# Patient Record
Sex: Female | Born: 1965
Health system: Southern US, Community
[De-identification: ages and names within clinical notes are randomized; demographics above are authoritative.]

## PROBLEM LIST (undated history)

## (undated) DIAGNOSIS — IMO0001 Reserved for inherently not codable concepts without codable children: Secondary | ICD-10-CM

## (undated) DIAGNOSIS — Z923 Personal history of irradiation: Secondary | ICD-10-CM

## (undated) DIAGNOSIS — IMO0002 Reserved for concepts with insufficient information to code with codable children: Secondary | ICD-10-CM

## (undated) DIAGNOSIS — C50919 Malignant neoplasm of unspecified site of unspecified female breast: Secondary | ICD-10-CM

## (undated) DIAGNOSIS — Z8669 Personal history of other diseases of the nervous system and sense organs: Secondary | ICD-10-CM

## (undated) DIAGNOSIS — C50412 Malignant neoplasm of upper-outer quadrant of left female breast: Principal | ICD-10-CM

## (undated) DIAGNOSIS — F419 Anxiety disorder, unspecified: Secondary | ICD-10-CM

## (undated) HISTORY — PX: WISDOM TOOTH EXTRACTION: SHX21

## (undated) HISTORY — PX: COLONOSCOPY: SHX174

## (undated) HISTORY — DX: Reserved for concepts with insufficient information to code with codable children: IMO0002

## (undated) HISTORY — DX: Malignant neoplasm of upper-outer quadrant of left female breast: C50.412

## (undated) HISTORY — DX: Personal history of other diseases of the nervous system and sense organs: Z86.69

## (undated) HISTORY — DX: Malignant neoplasm of unspecified site of unspecified female breast: C50.919

## (undated) HISTORY — DX: Reserved for inherently not codable concepts without codable children: IMO0001

---

## 1994-02-26 HISTORY — PX: COLONOSCOPY: SHX174

## 2001-02-26 DIAGNOSIS — I639 Cerebral infarction, unspecified: Secondary | ICD-10-CM

## 2001-02-26 HISTORY — DX: Cerebral infarction, unspecified: I63.9

## 2002-08-21 ENCOUNTER — Other Ambulatory Visit: Admission: RE | Admit: 2002-08-21 | Discharge: 2002-08-21 | Payer: Self-pay | Admitting: *Deleted

## 2006-06-06 ENCOUNTER — Other Ambulatory Visit: Admission: RE | Admit: 2006-06-06 | Discharge: 2006-06-06 | Payer: Self-pay | Admitting: Gynecology

## 2007-01-07 ENCOUNTER — Other Ambulatory Visit: Admission: RE | Admit: 2007-01-07 | Discharge: 2007-01-07 | Payer: Self-pay | Admitting: Gynecology

## 2009-05-24 ENCOUNTER — Encounter: Admission: RE | Admit: 2009-05-24 | Discharge: 2009-05-24 | Payer: Self-pay | Admitting: Gynecology

## 2010-10-09 ENCOUNTER — Other Ambulatory Visit: Payer: Self-pay | Admitting: Gynecology

## 2010-10-09 DIAGNOSIS — Z1231 Encounter for screening mammogram for malignant neoplasm of breast: Secondary | ICD-10-CM

## 2010-10-16 ENCOUNTER — Ambulatory Visit
Admission: RE | Admit: 2010-10-16 | Discharge: 2010-10-16 | Disposition: A | Discharge status: Home / Self Care | Payer: BC Managed Care – PPO | Source: Ambulatory Visit | Attending: Gynecology | Admitting: Gynecology

## 2010-10-16 DIAGNOSIS — Z1231 Encounter for screening mammogram for malignant neoplasm of breast: Secondary | ICD-10-CM

## 2011-11-05 ENCOUNTER — Other Ambulatory Visit: Payer: Self-pay | Admitting: Gynecology

## 2011-11-05 DIAGNOSIS — Z1231 Encounter for screening mammogram for malignant neoplasm of breast: Secondary | ICD-10-CM

## 2011-11-12 ENCOUNTER — Ambulatory Visit
Admission: RE | Admit: 2011-11-12 | Discharge: 2011-11-12 | Disposition: A | Discharge status: Home / Self Care | Payer: BC Managed Care – PPO | Source: Ambulatory Visit | Attending: Gynecology | Admitting: Gynecology

## 2011-11-12 DIAGNOSIS — Z1231 Encounter for screening mammogram for malignant neoplasm of breast: Secondary | ICD-10-CM

## 2013-01-14 ENCOUNTER — Other Ambulatory Visit: Payer: Self-pay

## 2013-01-14 DIAGNOSIS — Z1231 Encounter for screening mammogram for malignant neoplasm of breast: Secondary | ICD-10-CM

## 2013-02-10 ENCOUNTER — Ambulatory Visit
Admission: RE | Admit: 2013-02-10 | Discharge: 2013-02-10 | Disposition: A | Discharge status: Home / Self Care | Payer: BC Managed Care – PPO | Source: Ambulatory Visit

## 2013-02-10 DIAGNOSIS — Z1231 Encounter for screening mammogram for malignant neoplasm of breast: Secondary | ICD-10-CM

## 2015-02-22 ENCOUNTER — Other Ambulatory Visit: Payer: Self-pay | Admitting: Gynecology

## 2015-02-22 DIAGNOSIS — R928 Other abnormal and inconclusive findings on diagnostic imaging of breast: Secondary | ICD-10-CM

## 2015-02-27 HISTORY — PX: BREAST LUMPECTOMY: SHX2

## 2015-03-03 ENCOUNTER — Other Ambulatory Visit: Payer: Self-pay | Admitting: Gynecology

## 2015-03-03 ENCOUNTER — Ambulatory Visit
Admission: RE | Admit: 2015-03-03 | Discharge: 2015-03-03 | Disposition: A | Discharge status: Home / Self Care | Payer: BLUE CROSS/BLUE SHIELD | Source: Ambulatory Visit | Attending: Gynecology | Admitting: Gynecology

## 2015-03-03 DIAGNOSIS — R928 Other abnormal and inconclusive findings on diagnostic imaging of breast: Secondary | ICD-10-CM

## 2015-03-03 DIAGNOSIS — N6320 Unspecified lump in the left breast, unspecified quadrant: Secondary | ICD-10-CM

## 2015-03-07 ENCOUNTER — Other Ambulatory Visit: Payer: Self-pay | Admitting: Gynecology

## 2015-03-07 DIAGNOSIS — N6320 Unspecified lump in the left breast, unspecified quadrant: Secondary | ICD-10-CM

## 2015-03-08 ENCOUNTER — Ambulatory Visit
Admission: RE | Admit: 2015-03-08 | Discharge: 2015-03-08 | Disposition: A | Discharge status: Home / Self Care | Payer: BLUE CROSS/BLUE SHIELD | Source: Ambulatory Visit | Attending: Gynecology | Admitting: Gynecology

## 2015-03-08 DIAGNOSIS — N6320 Unspecified lump in the left breast, unspecified quadrant: Secondary | ICD-10-CM

## 2015-03-10 ENCOUNTER — Telehealth: Payer: Self-pay | Admitting: *Deleted

## 2015-03-10 ENCOUNTER — Encounter: Payer: Self-pay | Admitting: *Deleted

## 2015-03-10 DIAGNOSIS — C50412 Malignant neoplasm of upper-outer quadrant of left female breast: Secondary | ICD-10-CM

## 2015-03-10 HISTORY — DX: Malignant neoplasm of upper-outer quadrant of left female breast: C50.412

## 2015-03-10 NOTE — Telephone Encounter (Signed)
Confirmed [facility] for 03/16/15 at 1230 .  Instructions and contact information given. 

## 2015-03-11 ENCOUNTER — Telehealth: Payer: Self-pay | Admitting: *Deleted

## 2015-03-11 NOTE — Telephone Encounter (Signed)
Mailed clinic packet to pt.  

## 2015-03-16 ENCOUNTER — Encounter: Payer: Self-pay | Admitting: Nurse Practitioner

## 2015-03-16 ENCOUNTER — Encounter: Payer: Self-pay | Admitting: Hematology and Oncology

## 2015-03-16 ENCOUNTER — Other Ambulatory Visit (HOSPITAL_BASED_OUTPATIENT_CLINIC_OR_DEPARTMENT_OTHER): Payer: BLUE CROSS/BLUE SHIELD

## 2015-03-16 ENCOUNTER — Ambulatory Visit (HOSPITAL_BASED_OUTPATIENT_CLINIC_OR_DEPARTMENT_OTHER): Payer: BLUE CROSS/BLUE SHIELD | Admitting: Hematology and Oncology

## 2015-03-16 ENCOUNTER — Encounter: Payer: Self-pay | Admitting: Physical Therapy

## 2015-03-16 ENCOUNTER — Other Ambulatory Visit: Payer: Self-pay | Admitting: *Deleted

## 2015-03-16 ENCOUNTER — Other Ambulatory Visit: Payer: Self-pay | Admitting: General Surgery

## 2015-03-16 ENCOUNTER — Ambulatory Visit
Admission: RE | Admit: 2015-03-16 | Discharge: 2015-03-16 | Disposition: A | Discharge status: Home / Self Care | Payer: BLUE CROSS/BLUE SHIELD | Source: Ambulatory Visit | Attending: Radiation Oncology | Admitting: Radiation Oncology

## 2015-03-16 ENCOUNTER — Ambulatory Visit: Payer: BLUE CROSS/BLUE SHIELD | Attending: General Surgery | Admitting: Physical Therapy

## 2015-03-16 VITALS — BP 138/73 | HR 83 | Temp 98.2°F | Resp 18 | Ht 64.0 in | Wt 147.2 lb

## 2015-03-16 DIAGNOSIS — C50412 Malignant neoplasm of upper-outer quadrant of left female breast: Secondary | ICD-10-CM

## 2015-03-16 DIAGNOSIS — R293 Abnormal posture: Secondary | ICD-10-CM | POA: Diagnosis present

## 2015-03-16 DIAGNOSIS — C50912 Malignant neoplasm of unspecified site of left female breast: Secondary | ICD-10-CM

## 2015-03-16 DIAGNOSIS — Z170 Estrogen receptor positive status [ER+]: Secondary | ICD-10-CM

## 2015-03-16 LAB — CBC WITH DIFFERENTIAL/PLATELET
BASO%: 0.5 % (ref 0.0–2.0)
Basophils Absolute: 0.1 10*3/uL (ref 0.0–0.1)
EOS%: 2.4 % (ref 0.0–7.0)
Eosinophils Absolute: 0.3 10*3/uL (ref 0.0–0.5)
HEMATOCRIT: 42.7 % (ref 34.8–46.6)
HGB: 14.4 g/dL (ref 11.6–15.9)
LYMPH#: 2.9 10*3/uL (ref 0.9–3.3)
LYMPH%: 27.2 % (ref 14.0–49.7)
MCH: 33.5 pg (ref 25.1–34.0)
MCHC: 33.7 g/dL (ref 31.5–36.0)
MCV: 99.4 fL (ref 79.5–101.0)
MONO#: 0.5 10*3/uL (ref 0.1–0.9)
MONO%: 4.9 % (ref 0.0–14.0)
NEUT#: 7 10*3/uL — ABNORMAL HIGH (ref 1.5–6.5)
NEUT%: 65 % (ref 38.4–76.8)
Platelets: 263 10*3/uL (ref 145–400)
RBC: 4.29 10*6/uL (ref 3.70–5.45)
RDW: 12.7 % (ref 11.2–14.5)
WBC: 10.7 10*3/uL — ABNORMAL HIGH (ref 3.9–10.3)

## 2015-03-16 LAB — COMPREHENSIVE METABOLIC PANEL
ALT: 13 U/L (ref 0–55)
AST: 16 U/L (ref 5–34)
Albumin: 4.1 g/dL (ref 3.5–5.0)
Alkaline Phosphatase: 59 U/L (ref 40–150)
Anion Gap: 8 mEq/L (ref 3–11)
BUN: 9.3 mg/dL (ref 7.0–26.0)
CHLORIDE: 107 meq/L (ref 98–109)
CO2: 27 meq/L (ref 22–29)
CREATININE: 0.8 mg/dL (ref 0.6–1.1)
Calcium: 9.3 mg/dL (ref 8.4–10.4)
EGFR: 83 mL/min/{1.73_m2} — ABNORMAL LOW (ref >90)
GLUCOSE: 95 mg/dL (ref 70–140)
Potassium: 3.9 mEq/L (ref 3.5–5.1)
SODIUM: 142 meq/L (ref 136–145)
Total Bilirubin: 1.22 mg/dL — ABNORMAL HIGH (ref 0.20–1.20)
Total Protein: 6.9 g/dL (ref 6.4–8.3)

## 2015-03-16 MED ORDER — ZOLPIDEM TARTRATE 5 MG PO TABS: 5.0000 mg | ORAL_TABLET | Freq: Every evening | ORAL | Status: DC | PRN

## 2015-03-16 NOTE — Therapy (Signed)
[facility] [place], [place], [address] Phone: [phone]   Fax:  [phone]  Physical Therapy Evaluation  Patient Details  Name: Bethany Valdez MRN: [id number] Date of Birth: 07/29/1965 Referring Provider: Dr. Rolm Bookbinder  Encounter Date: 03/16/2015      PT End of Session - 03/16/15 1724    Visit Number 1   Number of Visits 1   PT Start Time 1346   PT Stop Time 6754  Also saw pt from 1520-1534 for a total of 23 minutes   PT Time Calculation (min) 9 min   Activity Tolerance Patient tolerated treatment well   Behavior During Therapy [facility] for tasks assessed/performed      Past Medical History  Diagnosis Date  . Breast cancer of upper-outer quadrant of left female breast ([place]) 03/10/2015  . Breast cancer ([place])     History reviewed. No pertinent past surgical history.  There were no vitals filed for this visit.  Visit Diagnosis:  Malignant neoplasm of left female breast, unspecified site of breast ([place]) - Plan: PT plan of care cert/re-cert  Abnormal posture - Plan: PT plan of care cert/re-cert      Subjective Assessment - 03/16/15 1717    Subjective Patient was seen today for a baseline assessment of her newly diagnosed left breast cancer.   Pertinent History Patient was diagnosed on 02/15/15 with left low grade invasive ductal carcinoma breast cancer.  It measures 1.6 cm, is ER/PR positive, HER2 negative, and has a Ki67 of 5%. Her case was discussed today at the breast multidisciplinary conference with PT present to determine best treatment plans.   Patient Stated Goals Reduce lymphedema risk and learn post op shoulder ROM HEP   Currently in Pain? No/denies            [facility] PT Assessment - 03/16/15 0001    Assessment   Medical Diagnosis Left breast cancer   Referring Provider Dr. Rolm Bookbinder   Onset Date/Surgical Date 02/15/15   Hand Dominance Right   Prior Therapy none    Precautions   Precautions Other (comment)   Precaution Comments active breast cancer   Restrictions   Weight Bearing Restrictions No   Balance Screen   Has the patient fallen in the past 6 months No   Has the patient had a decrease in activity level because of a fear of falling?  No   Is the patient reluctant to leave their home because of a fear of falling?  No   Home Ecologist residence   Living Arrangements Children  Lives with her 33 y.o. son   Available Help at Discharge Family   Prior Function   Level of [place] Full time employment   Proofreader office lead at [employer] She jogs 3-4x/week for 30 minutes   Cognition   Overall Cognitive Status Within Functional Limits for tasks assessed   Posture/Postural Control   Posture/Postural Control Postural limitations   Postural Limitations Rounded Shoulders   ROM / Strength   AROM / PROM / Strength AROM;Strength   AROM   AROM Assessment Site Shoulder   Right/Left Shoulder Right;Left   Right Shoulder Extension 68 Degrees   Right Shoulder Flexion 161 Degrees   Right Shoulder ABduction 166 Degrees   Right Shoulder Internal Rotation 76 Degrees   Right Shoulder External Rotation 80 Degrees   Left Shoulder Extension 65 Degrees   Left  Shoulder Flexion 162 Degrees   Left Shoulder ABduction 174 Degrees   Left Shoulder Internal Rotation 68 Degrees   Left Shoulder External Rotation 83 Degrees   Strength   Overall Strength Within functional limits for tasks performed           LYMPHEDEMA/ONCOLOGY QUESTIONNAIRE - 03/16/15 1721    Type   Cancer Type Left breast cancer   Lymphedema Assessments   Lymphedema Assessments Upper extremities   Right Upper Extremity Lymphedema   10 cm Proximal to Olecranon Process 26.8 cm   Olecranon Process 24 cm   10 cm Proximal to Ulnar Styloid Process 21.5 cm   Just Proximal to Ulnar Styloid  Process 14.8 cm   Across Hand at [employer] 18.9 cm   At [place] of 2nd Digit 6.1 cm   Left Upper Extremity Lymphedema   10 cm Proximal to Olecranon Process 25.9 cm   Olecranon Process 23.7 cm   10 cm Proximal to Ulnar Styloid Process 20.5 cm   Just Proximal to Ulnar Styloid Process 14.7 cm   Across Hand at [employer] 18.3 cm   At [place] of 2nd Digit 6.1 cm        Patient was instructed today in a home exercise program today for post op shoulder range of motion. These included active assist shoulder flexion in sitting, scapular retraction, wall walking with shoulder abduction, and hands behind head external rotation.  She was encouraged to do these twice a day, holding 3 seconds and repeating 5 times when permitted by her physician.         PT Education - 03/16/15 1722    Education provided Yes   Education Details Lymphedema risk reduction and post op shoulder ROM HEP   Person(s) Educated Patient   Methods Explanation;Demonstration;Handout   Comprehension Verbalized understanding;Returned demonstration             Breast Clinic Goals - 03/16/15 1727    Patient will be able to verbalize understanding of pertinent lymphedema risk reduction practices relevant to her diagnosis specifically related to skin care.   Time 1   Period Days   Status Achieved   Patient will be able to return demonstrate and/or verbalize understanding of the post-op home exercise program related to regaining shoulder range of motion.   Time 1   Period Days   Status Achieved   Patient will be able to verbalize understanding of the importance of attending the postoperative After Breast Cancer Class for further lymphedema risk reduction education and therapeutic exercise.   Time 1   Period Days   Status Achieved              Plan - 03/16/15 1724    Clinical Impression Statement Patient was diagnosed on 02/15/15 with left low grade invasive ductal carcinoma breast cancer.  It measures  1.6 cm, is ER/PR positive, HER2 negative, and has a Ki67 of 5%. Her case was discussed today at the breast multidisciplinary conference with PT present to determine best treatment plans.  She is planning to have a left lumpectomy and sentinel node biopsy fiollowed by Oncotype testing, radiation, and anti-estrogen therapy.  She may benefit from post op PT to regain shoulder ROM.   Pt will benefit from skilled therapeutic intervention in order to improve on the following deficits Decreased strength;Decreased knowledge of precautions;Pain;Impaired UE functional use;Decreased range of motion   Rehab Potential Excellent   Clinical Impairments Affecting Rehab Potential none   PT Frequency One time  visit   PT Treatment/Interventions Therapeutic exercise;Patient/family education   PT Next Visit Plan F/u post op for PT needs   PT Home Exercise Plan shoulder ROM   Consulted and Agree with Plan of Care Patient       Patient will follow up at outpatient cancer rehab if needed following surgery.  If the patient requires physical therapy at that time, a specific plan will be dictated and sent to the referring physician for approval. The patient was educated today on appropriate basic range of motion exercises to begin post operatively and the importance of attending the After Breast Cancer class following surgery.  Patient was educated today on lymphedema risk reduction practices as it pertains to recommendations that will benefit the patient immediately following surgery.  She verbalized good understanding.  No additional physical therapy is indicated at this time.      Problem List Patient Active Problem List   Diagnosis Date Noted  . Breast cancer of upper-outer quadrant of left female breast ([place]) 03/10/2015   Annia Friendly, PT 03/16/2015 5:28 PM  [facility] [place], [place], [address] Phone: [phone]   Fax:   [phone]  Name: Arianna Haydon MRN: [id number] Date of Birth: 1965/11/05

## 2015-03-16 NOTE — Assessment & Plan Note (Signed)
Left breast 1:00 biopsy: Invasive ductal carcinoma with DCIS, grade 1-2, ER 90%, PR 100%, HER-2 negative ratio 1.35, Ki-67 5%, 1.6 x 1.2 x 1 cm left breast distortion, T1 cN0 stage IA clinical stage  Pathology and radiology counseling:Discussed with the patient, the details of pathology including the type of breast cancer,the clinical staging, the significance of ER, PR and HER-2/neu receptors and the implications for treatment. After reviewing the pathology in detail, we proceeded to discuss the different treatment options between surgery, radiation, chemotherapy, antiestrogen therapies.  Recommendations: 1. Breast conserving surgery followed by 2. Oncotype DX testing to determine if chemotherapy would be of any benefit followed by 3. Adjuvant radiation therapy followed by 4. Adjuvant antiestrogen therapy  Oncotype counseling: I discussed Oncotype DX test. I explained to the patient that this is a 21 gene panel to evaluate patient tumors DNA to calculate recurrence score. This would help determine whether patient has high risk or intermediate risk or low risk breast cancer. She understands that if her tumor was found to be high risk, she would benefit from systemic chemotherapy. If low risk, no need of chemotherapy. If she was found to be intermediate risk, we would need to evaluate the score as well as other risk factors and determine if an abbreviated chemotherapy may be of benefit.  Return to clinic after surgery to discuss final pathology report and then determine if Oncotype DX testing will need to be sent.   

## 2015-03-16 NOTE — Patient Instructions (Signed)

## 2015-03-16 NOTE — Progress Notes (Signed)
Radiation Oncology         [phone] ________________________________  Initial Outpatient Consultation  Name: Bethany Valdez MRN: [id number]  Date: 03/16/2015  DOB: 09-Feb-1966  CC:[name] Patient  Rolm Bookbinder, MD   REFERRING PHYSICIAN: Rolm Bookbinder, MD  DIAGNOSIS:   ICD-9-CM ICD-10-CM   1. Breast cancer of upper-outer quadrant of left female breast ([place]) 174.4 C50.412    Clinical stage TIc, N0 Left invasive and in situ ductal carcinoma, ER 90%, PR 100%, Her2-neu negative with a 1.35 ratio, and KI67 5%.  HISTORY OF PRESENT ILLNESS::Bethany Valdez is a 50 y.o. female who presents to the clinic with an abnormal mammogram. Architectural distortion was noted in the upper-outer quadrant of the left breast, 1:00 position . She had a biopsy 03/07/2014 revealing 1.6 x 1.2 x 1.0 cm mass that showed invasive ductal carcinoma of the left breast that is ER 90%, PR 100%, Her2-neu negative with a 1.35 ratio, and Ki67 5%. Prior to diagnosis patient denied any pain in the breast area nipple discharge or bleeding.   PREVIOUS RADIATION THERAPY: No  PAST MEDICAL HISTORY:  has a past medical history of Breast cancer of upper-outer quadrant of left female breast ([place]) (03/10/2015).    PAST SURGICAL HISTORY:No past surgical history on file.none  FAMILY HISTORY: family history is not on file.  SOCIAL HISTORY:   has a 78 year old son, works 2 jobs   ALLERGIES: Review of patient's allergies indicates no known allergies.  MEDICATIONS:  No current outpatient prescriptions on file.   No current facility-administered medications for this encounter.    REVIEW OF SYSTEMS:  A 15 point review of systems is documented in the electronic medical record. This was obtained by the nursing staff. However, I reviewed this with the patient to discuss relevant findings and make appropriate changes.  Pertinent items are noted in HPI.   PHYSICAL EXAM:  Vitals - 1 value per visit 6/44/0347  SYSTOLIC 425    DIASTOLIC 73  Pulse 83  Temperature 98.2  Respirations 18  Weight (lb) 147.2  Height '5\' 4"'   BMI 25.25  VISIT REPORT    General: Alert and oriented, in no acute distress, accompanied by a friend HEENT: Head is normocephalic.  Neck: Neck is supple, no palpable cervical or supraclavicular lymphadenopathy. Heart: Regular in rate and rhythm with no murmurs, rubs, or gallops. Chest: Clear to auscultation bilaterally, with no rhonchi, wheezes, or rales. Extremities: No cyanosis or edema. Lymphatics: see Neck Exam Skin: No concerning lesions. Neurologic: No obvious focalities. Speech is fluent. Coordination is intact. Psychiatric: Judgment and insight are intact. Affect is appropriate. Left breast small biopsy site in upper outer quadrant with some associated bruising. No palpable mass, nipple discharge, or bleeding in the left or right breast.  ECOG = 0  LABORATORY DATA:  Lab Results  Component Value Date   WBC 10.7* 03/16/2015   HGB 14.4 03/16/2015   HCT 42.7 03/16/2015   MCV 99.4 03/16/2015   PLT 263 03/16/2015   NEUTROABS 7.0* 03/16/2015   No results found for: NA, K, CL, CO2, GLUCOSE, CREATININE, CALCIUM    RADIOGRAPHY: Mm Digital Diagnostic Unilat L  03/08/2015  CLINICAL DATA:  Post biopsy mammogram of the left breast for clip placement. EXAM: DIAGNOSTIC LEFT MAMMOGRAM POST ULTRASOUND BIOPSY COMPARISON:  Previous exam(s). FINDINGS: Mammographic images were obtained following ultrasound guided biopsy of mass in the left breast 1 o'clock. The ribbon shaped biopsy marking clip is appropriately positioned at the intended site of biopsy at 1 o'clock  in the left breast. IMPRESSION: Appropriate positioning of the ribbon shaped biopsy marking clip at the site of biopsy at 1 o'clock in the left breast. Final Assessment: Post Procedure Mammograms for Marker Placement Electronically Signed   By: Ammie Ferrier M.D.   On: 03/08/2015 16:34   US Breast Ltd Uni Left Inc Axilla  03/03/2015   CLINICAL DATA:  Patient returns today for further evaluation of a possible distortion within the outer left breast identified on recent screening mammogram. EXAM: DIGITAL DIAGNOSTIC LEFT MAMMOGRAM WITH 3D TOMOSYNTHESIS WITH CAD ULTRASOUND LEFT BREAST COMPARISON:  Previous exams including recent screening mammogram dated 02/15/2015. ACR Breast Density Category c: The breast tissue is heterogeneously dense, which may obscure small masses. FINDINGS: Left breast CC, MLO and true lateral views were obtained today with 3D tomosynthesis. An area of distortion is confirmed within the upper-outer quadrant of the left breast, at posterior depth. Mammographic images were processed with CAD. Targeted ultrasound is performed, showing a spiculated hypoechoic mass within the left breast at the 1 o'clock axis, 6 cm from the nipple, measuring 1.6 x 1.2 x 1 cm, corresponding to the mammographic finding. IMPRESSION: Spiculated mass within the left breast at the 1 o'clock axis, 6 cm from the nipple, measuring 1.6 x 1.2 x 1 cm, corresponding to an area of architectural distortion seen on mammogram. This is a highly suspicious finding for which ultrasound-guided biopsy is recommended. RECOMMENDATION: Ultrasound-guided biopsy of the left breast mass, as detailed above. Ultrasound-guided biopsy is scheduled for January 10th at 3 p.m. I have discussed the findings and recommendations with the patient. Results were also provided in writing at the conclusion of the visit. If applicable, a reminder letter will be sent to the patient regarding the next appointment. BI-RADS CATEGORY  5: Highly suggestive of malignancy. Electronically Signed   By: Franki Cabot M.D.   On: 03/03/2015 16:55   Mm Diag Breast Tomo Uni Left  03/03/2015  CLINICAL DATA:  Patient returns today for further evaluation of a possible distortion within the outer left breast identified on recent screening mammogram. EXAM: DIGITAL DIAGNOSTIC LEFT MAMMOGRAM WITH 3D  TOMOSYNTHESIS WITH CAD ULTRASOUND LEFT BREAST COMPARISON:  Previous exams including recent screening mammogram dated 02/15/2015. ACR Breast Density Category c: The breast tissue is heterogeneously dense, which may obscure small masses. FINDINGS: Left breast CC, MLO and true lateral views were obtained today with 3D tomosynthesis. An area of distortion is confirmed within the upper-outer quadrant of the left breast, at posterior depth. Mammographic images were processed with CAD. Targeted ultrasound is performed, showing a spiculated hypoechoic mass within the left breast at the 1 o'clock axis, 6 cm from the nipple, measuring 1.6 x 1.2 x 1 cm, corresponding to the mammographic finding. IMPRESSION: Spiculated mass within the left breast at the 1 o'clock axis, 6 cm from the nipple, measuring 1.6 x 1.2 x 1 cm, corresponding to an area of architectural distortion seen on mammogram. This is a highly suspicious finding for which ultrasound-guided biopsy is recommended. RECOMMENDATION: Ultrasound-guided biopsy of the left breast mass, as detailed above. Ultrasound-guided biopsy is scheduled for January 10th at 3 p.m. I have discussed the findings and recommendations with the patient. Results were also provided in writing at the conclusion of the visit. If applicable, a reminder letter will be sent to the patient regarding the next appointment. BI-RADS CATEGORY  5: Highly suggestive of malignancy. Electronically Signed   By: Franki Cabot M.D.   On: 03/03/2015 16:55   [place] Lt Breast  Bx W Loc Dev 1st Lesion Img Bx Spec US Guide  03/10/2015  ADDENDUM REPORT: 03/10/2015 08:04 ADDENDUM: Pathology revealed grade I to II invasive ductal carcinoma and ductal carcinoma in situ in the left breast. This was found to be concordant by Dr. Ammie Ferrier. Pathology was discussed with the patient by telephone. She reported doing well after the biopsy with minimal tenderness and bruising at the site. Post biopsy instructions and care  were reviewed and her questions were answered. She has been scheduled at [facility] on March 16, 2015. A bilateral breast MRI could be considered. She is encouraged to come to [facility] for educational materials. My number was provided for additional questions and concerns. Pathology results reported by Susa Raring RN, BSN on March 10, 2015. Electronically Signed   By: Ammie Ferrier M.D.   On: 03/10/2015 08:04  03/10/2015  CLINICAL DATA:  50 year old female presenting for ultrasound-guided biopsy of a left breast mass. EXAM: ULTRASOUND GUIDED LEFT BREAST CORE NEEDLE BIOPSY COMPARISON:  Previous exam(s). FINDINGS: I met with the patient and we discussed the procedure of ultrasound-guided biopsy, including benefits and alternatives. We discussed the high likelihood of a successful procedure. We discussed the risks of the procedure, including infection, bleeding, tissue injury, clip migration, and inadequate sampling. Informed written consent was given. The usual time-out protocol was performed immediately prior to the procedure. Prior to the start of the procedure, the left axilla was imaged. No definite abnormal lymph nodes were identified. Therefore, biopsy of the mass in the upper-outer left breast preceded as planned. Using sterile technique and 1% Lidocaine as local anesthetic, under direct ultrasound visualization, a 14 gauge spring-loaded device was used to perform biopsy of the mass in the upper-outer quadrant of the left breast at 1 o'clock using a lateral approach. At the conclusion of the procedure a ribbon shaped tissue marker clip was deployed into the biopsy cavity. Follow up 2 view mammogram was performed and dictated separately. IMPRESSION: 1. Ultrasound guided biopsy of a mass in the left breast at 1 o'clock. No apparent complications. 2.  No clearly abnormal left axillary lymphadenopathy. Electronically Signed: By:  Ammie Ferrier M.D. On: 03/08/2015 16:22    IMPRESSION: Ms. Bordenave is a 50 yo female with left invasive and in situ ductal carcinoma. She is a good candidate for a lumpectomy and sentinel node biopsy, oncotype, external radiation, and hormone therapy.  PLAN: She will be scheduled for a lumpectomy and sentinel node biopsy. Following surgery, she will receive external radiation and hormone therapy.     ------------------------------------------------  Blair Promise, PhD, MD    This document serves as a record of services personally performed by Gery Pray, MD. It was created on his behalf by Lendon Collar, a trained medical scribe. The creation of this record is based on the scribe's personal observations and the provider's statements to them. This document has been checked and approved by the attending provider.

## 2015-03-16 NOTE — Progress Notes (Signed)
[place] CONSULT NOTE  Patient Care Team: [name] Patient as PCP - General (General Practice)  CHIEF COMPLAINTS/PURPOSE OF CONSULTATION:  Newly diagnosed breast cancer  HISTORY OF PRESENTING ILLNESS:  Bethany Valdez 50 y.o. female is here because of recent diagnosis of left breast cancer. Patient had a screening mammogram that revealed left breast distortion at 1:00 position measuring 1.6 x 1.2 x 1 cm. Ultrasound-guided biopsy was performed which revealed low-grade invasive ductal carcinoma with DCIS that was ER 90%, PR 100%, HER-2 negative, Ki-67 5%. She was presented this morning of the multidisciplinary tumor board and she is here today at [facility] clinic to discuss the treatment plan.  I reviewed her records extensively and collaborated the history with the patient.  SUMMARY OF ONCOLOGIC HISTORY:   Breast cancer of upper-outer quadrant of left female breast ([place])   03/08/2015 Initial Diagnosis Left breast 1:00 biopsy: Invasive ductal carcinoma with DCIS, grade 1-2, ER 90%, PR 100%, HER-2 negative ratio 1.35, Ki-67 5%, 1.6 x 1.2 x 1 cm left breast distortion, T1 cN0 stage IA clinical stage    In terms of breast cancer risk profile:  She menarched at early age of 24  She had one pregnancy, her first child was born at age 68  She has not received birth control pills.  She was never exposed to fertility medications or hormone replacement therapy.  She has no family history of Breast/GYN/GI cancer  MEDICAL HISTORY:  Past Medical History  Diagnosis Date  . Breast cancer of upper-outer quadrant of left female breast ([place]) 03/10/2015  . Breast cancer ([place])     SURGICAL HISTORY: History reviewed. No pertinent past surgical history.  SOCIAL HISTORY: Social History   Social History  . Marital Status: Unknown    Spouse Name: N/A  . Number of Children: N/A  . Years of Education: N/A   Occupational History  . Not on file.   Social History Main Topics  . Smoking  status: Never Smoker   . Smokeless tobacco: Not on file  . Alcohol Use: Yes  . Drug Use: No  . Sexual Activity: Not on file   Other Topics Concern  . Not on file   Social History Narrative    FAMILY HISTORY: History reviewed. No pertinent family history.  ALLERGIES:  has No Known Allergies.  MEDICATIONS:  Current Outpatient Prescriptions  Medication Sig Dispense Refill  . zolpidem (AMBIEN) 5 MG tablet Take 1 tablet (5 mg total) by mouth at bedtime as needed for sleep. 10 tablet 0   No current facility-administered medications for this visit.    REVIEW OF SYSTEMS:   Constitutional: Denies fevers, chills or abnormal night sweats Eyes: Denies blurriness of vision, double vision or watery eyes Ears, nose, mouth, throat, and face: Denies mucositis or sore throat Respiratory: Denies cough, dyspnea or wheezes Cardiovascular: Denies palpitation, chest discomfort or lower extremity swelling Gastrointestinal:  Denies nausea, heartburn or change in bowel habits Skin: Denies abnormal skin rashes Lymphatics: Denies new lymphadenopathy or easy bruising Neurological:Denies numbness, tingling or new weaknesses Behavioral/Psych: Mood is stable, no new changes  Breast:  Denies any palpable lumps or discharge All other systems were reviewed with the patient and are negative.  PHYSICAL EXAMINATION: ECOG PERFORMANCE STATUS: 0 - Asymptomatic  Filed Vitals:   03/16/15 1248  BP: 138/73  Pulse: 83  Temp: 98.2 F (36.8 C)  Resp: 18   Filed Weights   03/16/15 1248  Weight: 147 lb 3.2 oz (66.769 kg)  GENERAL:alert, no distress and comfortable SKIN: skin color, texture, turgor are normal, no rashes or significant lesions EYES: normal, conjunctiva are pink and non-injected, sclera clear OROPHARYNX:no exudate, no erythema and lips, buccal mucosa, and tongue normal  NECK: supple, thyroid normal size, non-tender, without nodularity LYMPH:  no palpable lymphadenopathy in the cervical,  axillary or inguinal LUNGS: clear to auscultation and percussion with normal breathing effort HEART: regular rate & rhythm and no murmurs and no lower extremity edema ABDOMEN:abdomen soft, non-tender and normal bowel sounds Musculoskeletal:no cyanosis of digits and no clubbing  PSYCH: alert & oriented x 3 with fluent speech NEURO: no focal motor/sensory deficits BREAST: No palpable nodules in breast. No palpable axillary or supraclavicular lymphadenopathy (exam performed in the presence of a chaperone)   LABORATORY DATA:  I have reviewed the data as listed Lab Results  Component Value Date   WBC 10.7* 03/16/2015   HGB 14.4 03/16/2015   HCT 42.7 03/16/2015   MCV 99.4 03/16/2015   PLT 263 03/16/2015   Lab Results  Component Value Date   NA 142 03/16/2015   K 3.9 03/16/2015   CO2 27 03/16/2015    RADIOGRAPHIC STUDIES: I have personally reviewed the radiological reports and agreed with the findings in the report.  ASSESSMENT AND PLAN:  Breast cancer of upper-outer quadrant of left female breast ([place]) Left breast 1:00 biopsy: Invasive ductal carcinoma with DCIS, grade 1-2, ER 90%, PR 100%, HER-2 negative ratio 1.35, Ki-67 5%, 1.6 x 1.2 x 1 cm left breast distortion, T1 cN0 stage IA clinical stage  Pathology and radiology counseling:Discussed with the patient, the details of pathology including the type of breast cancer,the clinical staging, the significance of ER, PR and HER-2/neu receptors and the implications for treatment. After reviewing the pathology in detail, we proceeded to discuss the different treatment options between surgery, radiation, chemotherapy, antiestrogen therapies.  Recommendations: 1. Breast conserving surgery followed by 2. Oncotype DX testing to determine if chemotherapy would be of any benefit followed by 3. Adjuvant radiation therapy followed by 4. Adjuvant antiestrogen therapy  Oncotype counseling: I discussed Oncotype DX test. I explained to the patient  that this is a 21 gene panel to evaluate patient tumors DNA to calculate recurrence score. This would help determine whether patient has high risk or intermediate risk or low risk breast cancer. She understands that if her tumor was found to be high risk, she would benefit from systemic chemotherapy. If low risk, no need of chemotherapy. If she was found to be intermediate risk, we would need to evaluate the score as well as other risk factors and determine if an abbreviated chemotherapy may be of benefit.  Return to clinic after surgery to discuss final pathology report and then determine if Oncotype DX testing will need to be sent.   All questions were answered. The patient knows to call the clinic with any problems, questions or concerns.    Rulon Eisenmenger, MD 03/16/2015

## 2015-03-16 NOTE — Progress Notes (Signed)
Bethany Valdez is a very pleasant 50 y.o. female from [place], [place] with newly diagnosed invasive ductal carcinoma with DCIS of the left breast.  Biopsy results revealed the tumor's prognostic profile is ER positive, PR positive, and HER2/neu negative.   She presents today with her friend to the [facility]) for treatment consideration and recommendations from the breast surgeon, radiation oncologist, and medical oncologist.     I briefly met with Bethany Valdez and her friend during her [facility] visit today. We discussed the purpose of the Survivorship Clinic, which will include monitoring for recurrence, coordinating completion of age and gender-appropriate cancer screenings, promotion of overall wellness, as well as managing potential late/long-term side effects of anti-cancer treatments.    The treatment plan for Bethany Valdez will likely include surgery, radiation therapy, and anti-estrogen therapy. As of today, the intent of treatment for Bethany Valdez is cure, therefore she will be eligible for the Survivorship Clinic upon her completion of treatment.  Her survivorship care plan (SCP) document will be drafted and updated throughout the course of her treatment trajectory. She will receive the SCP in an office visit with myself in the Survivorship Clinic once she has completed treatment.   Bethany Valdez was encouraged to ask questions and all questions were answered to her satisfaction.  She was given my business card and encouraged to contact me with any concerns regarding survivorship.  I look forward to participating in her care.   Kenn File, [place] [phone]

## 2015-03-16 NOTE — Progress Notes (Signed)
Note created by Dr. Gudena during office visit, copy to patient,original to scan. 

## 2015-03-18 ENCOUNTER — Other Ambulatory Visit: Payer: Self-pay | Admitting: General Surgery

## 2015-03-18 DIAGNOSIS — C50412 Malignant neoplasm of upper-outer quadrant of left female breast: Secondary | ICD-10-CM

## 2015-03-18 NOTE — Progress Notes (Signed)
    [facility] Psychosocial Distress Screening Counseling Intern  Counseling Intern was referred by distress screening protocol.  The patient scored a 8 on the Psychosocial Distress Thermometer which indicates Severe distress. Counseling Intern Bethany Valdez to assess for distress and other psychosocial needs.   ON[facility]N DISTRESS SCREENING 03/16/2015  Screening Type Initial Screening  Distress experienced in past week (1-10) 8  Information Concerns Type Lack of info about diagnosis;Lack of info about treatment  Physical Problem type Sleep/insomnia  Referral to support programs Yes   Counseling Intern Note: Met with Pt and friend at the end of [facility].  Pt reported that her distress had decreased from an 8 to a 2-3 by the time of our encounter.  Pt stated that her information needs around her Dx and Tx had been met during clinic and stated that her sleep/insomnia was partially due to increased stress around her Dx and had been addressed during clinic as well.  Pt reported a strong support network in friends and her 28 yo son.  Pt verbalized relief and optimism related to her Dx and Tx post clinic. Counseling Intern introduced and provided information on services and resources available at the support center.   No specific support center follow up is indicated at this time.   Bethany Valdez Counseling Intern  [phone]

## 2015-03-21 ENCOUNTER — Telehealth: Payer: Self-pay | Admitting: *Deleted

## 2015-03-21 ENCOUNTER — Encounter (HOSPITAL_BASED_OUTPATIENT_CLINIC_OR_DEPARTMENT_OTHER): Payer: Self-pay | Admitting: *Deleted

## 2015-03-21 NOTE — Telephone Encounter (Signed)
Left vm for pt to return call regarding [place] from 03/16/15. Contact information provided.

## 2015-03-22 ENCOUNTER — Telehealth: Payer: Self-pay | Admitting: Hematology and Oncology

## 2015-03-22 NOTE — Telephone Encounter (Signed)
Called and left a message with her follow up °

## 2015-03-23 ENCOUNTER — Ambulatory Visit
Admission: RE | Admit: 2015-03-23 | Discharge: 2015-03-23 | Disposition: A | Discharge status: Home / Self Care | Payer: BLUE CROSS/BLUE SHIELD | Source: Ambulatory Visit | Attending: General Surgery | Admitting: General Surgery

## 2015-03-23 DIAGNOSIS — C50412 Malignant neoplasm of upper-outer quadrant of left female breast: Secondary | ICD-10-CM

## 2015-03-24 ENCOUNTER — Ambulatory Visit (HOSPITAL_BASED_OUTPATIENT_CLINIC_OR_DEPARTMENT_OTHER): Payer: BLUE CROSS/BLUE SHIELD | Admitting: Anesthesiology

## 2015-03-24 ENCOUNTER — Ambulatory Visit
Admission: RE | Admit: 2015-03-24 | Discharge: 2015-03-24 | Disposition: A | Discharge status: Home / Self Care | Payer: BLUE CROSS/BLUE SHIELD | Source: Ambulatory Visit | Attending: General Surgery | Admitting: General Surgery

## 2015-03-24 ENCOUNTER — Ambulatory Visit (HOSPITAL_BASED_OUTPATIENT_CLINIC_OR_DEPARTMENT_OTHER)
Admission: RE | Admit: 2015-03-24 | Discharge: 2015-03-24 | Disposition: A | Discharge status: Home / Self Care | Payer: BLUE CROSS/BLUE SHIELD | Source: Ambulatory Visit | Attending: General Surgery | Admitting: General Surgery

## 2015-03-24 ENCOUNTER — Ambulatory Visit (HOSPITAL_COMMUNITY)
Admission: RE | Admit: 2015-03-24 | Discharge: 2015-03-24 | Disposition: A | Discharge status: Home / Self Care | Payer: BLUE CROSS/BLUE SHIELD | Source: Ambulatory Visit | Attending: General Surgery | Admitting: General Surgery

## 2015-03-24 ENCOUNTER — Encounter (HOSPITAL_BASED_OUTPATIENT_CLINIC_OR_DEPARTMENT_OTHER)
Admission: RE | Disposition: A | Discharge status: Home / Self Care | Payer: Self-pay | Source: Ambulatory Visit | Attending: General Surgery

## 2015-03-24 ENCOUNTER — Encounter (HOSPITAL_BASED_OUTPATIENT_CLINIC_OR_DEPARTMENT_OTHER): Payer: Self-pay | Admitting: *Deleted

## 2015-03-24 DIAGNOSIS — C50412 Malignant neoplasm of upper-outer quadrant of left female breast: Secondary | ICD-10-CM

## 2015-03-24 DIAGNOSIS — C50912 Malignant neoplasm of unspecified site of left female breast: Secondary | ICD-10-CM | POA: Diagnosis present

## 2015-03-24 HISTORY — PX: RADIOACTIVE SEED GUIDED PARTIAL MASTECTOMY WITH AXILLARY SENTINEL LYMPH NODE BIOPSY: SHX6520

## 2015-03-24 HISTORY — DX: Anxiety disorder, unspecified: F41.9

## 2015-03-24 SURGERY — RADIOACTIVE SEED GUIDED PARTIAL MASTECTOMY WITH AXILLARY SENTINEL LYMPH NODE BIOPSY
Anesthesia: General | Site: Breast | Laterality: Left

## 2015-03-24 MED ORDER — OXYCODONE HCL 5 MG/5ML PO SOLN: 5.0000 mg | Freq: Once | ORAL | Status: DC | PRN

## 2015-03-24 MED ORDER — DEXAMETHASONE SODIUM PHOSPHATE 10 MG/ML IJ SOLN
INTRAMUSCULAR | Status: AC
  Filled 2015-03-24: qty 1

## 2015-03-24 MED ORDER — BUPIVACAINE-EPINEPHRINE (PF) 0.5% -1:200000 IJ SOLN
INTRAMUSCULAR | Status: DC | PRN
Start: 2015-03-24 — End: 2015-03-24
  Administered 2015-03-24: 25 mL via PERINEURAL

## 2015-03-24 MED ORDER — BUPIVACAINE HCL (PF) 0.25 % IJ SOLN
INTRAMUSCULAR | Status: DC | PRN
  Administered 2015-03-24: 3 mL

## 2015-03-24 MED ORDER — PROPOFOL 10 MG/ML IV BOLUS
INTRAVENOUS | Status: DC | PRN
  Administered 2015-03-24: 200 mg via INTRAVENOUS

## 2015-03-24 MED ORDER — LIDOCAINE HCL (CARDIAC) 20 MG/ML IV SOLN
INTRAVENOUS | Status: DC | PRN
  Administered 2015-03-24: 50 mg via INTRAVENOUS

## 2015-03-24 MED ORDER — MIDAZOLAM HCL 2 MG/2ML IJ SOLN
INTRAMUSCULAR | Status: AC
  Filled 2015-03-24: qty 2

## 2015-03-24 MED ORDER — MIDAZOLAM HCL 2 MG/2ML IJ SOLN
1.0000 mg | INTRAMUSCULAR | Status: DC | PRN
  Administered 2015-03-24: 2 mg via INTRAVENOUS

## 2015-03-24 MED ORDER — HYDROMORPHONE HCL 1 MG/ML IJ SOLN
0.2500 mg | INTRAMUSCULAR | Status: DC | PRN
  Administered 2015-03-24: 0.5 mg via INTRAVENOUS

## 2015-03-24 MED ORDER — SCOPOLAMINE 1 MG/3DAYS TD PT72: 1.0000 | MEDICATED_PATCH | Freq: Once | TRANSDERMAL | Status: DC | PRN

## 2015-03-24 MED ORDER — CEFAZOLIN SODIUM-DEXTROSE 2-3 GM-% IV SOLR
2.0000 g | INTRAVENOUS | Status: AC
  Administered 2015-03-24: 2 g via INTRAVENOUS

## 2015-03-24 MED ORDER — ARTIFICIAL TEARS OP OINT
TOPICAL_OINTMENT | OPHTHALMIC | Status: AC
  Filled 2015-03-24: qty 3.5

## 2015-03-24 MED ORDER — PROMETHAZINE HCL 25 MG/ML IJ SOLN
6.2500 mg | Freq: Once | INTRAMUSCULAR | Status: AC
  Administered 2015-03-24: 6.25 mg via INTRAVENOUS

## 2015-03-24 MED ORDER — PROMETHAZINE HCL 12.5 MG PO TABS: 12.5000 mg | ORAL_TABLET | Freq: Four times a day (QID) | ORAL | Status: DC | PRN

## 2015-03-24 MED ORDER — DEXAMETHASONE SODIUM PHOSPHATE 4 MG/ML IJ SOLN
INTRAMUSCULAR | Status: DC | PRN
  Administered 2015-03-24: 10 mg via INTRAVENOUS

## 2015-03-24 MED ORDER — PHENYLEPHRINE 40 MCG/ML (10ML) SYRINGE FOR IV PUSH (FOR BLOOD PRESSURE SUPPORT)
PREFILLED_SYRINGE | INTRAVENOUS | Status: AC
  Filled 2015-03-24: qty 10

## 2015-03-24 MED ORDER — SODIUM CHLORIDE 0.9 % IJ SOLN
INTRAMUSCULAR | Status: AC
  Filled 2015-03-24: qty 10

## 2015-03-24 MED ORDER — PROMETHAZINE HCL 25 MG/ML IJ SOLN
INTRAMUSCULAR | Status: AC
  Filled 2015-03-24: qty 1

## 2015-03-24 MED ORDER — OXYCODONE-ACETAMINOPHEN 10-325 MG PO TABS: 1.0000 | ORAL_TABLET | Freq: Four times a day (QID) | ORAL | Status: DC | PRN

## 2015-03-24 MED ORDER — FENTANYL CITRATE (PF) 100 MCG/2ML IJ SOLN
INTRAMUSCULAR | Status: AC
  Filled 2015-03-24: qty 2

## 2015-03-24 MED ORDER — BUPIVACAINE-EPINEPHRINE (PF) 0.25% -1:200000 IJ SOLN
INTRAMUSCULAR | Status: AC
  Filled 2015-03-24: qty 60

## 2015-03-24 MED ORDER — GLYCOPYRROLATE 0.2 MG/ML IJ SOLN: 0.2000 mg | Freq: Once | INTRAMUSCULAR | Status: DC | PRN

## 2015-03-24 MED ORDER — LACTATED RINGERS IV SOLN
INTRAVENOUS | Status: DC
  Administered 2015-03-24 (×2): via INTRAVENOUS

## 2015-03-24 MED ORDER — LIDOCAINE HCL (CARDIAC) 20 MG/ML IV SOLN
INTRAVENOUS | Status: AC
  Filled 2015-03-24: qty 5

## 2015-03-24 MED ORDER — ONDANSETRON HCL 4 MG/2ML IJ SOLN
INTRAMUSCULAR | Status: DC | PRN
  Administered 2015-03-24: 4 mg via INTRAVENOUS

## 2015-03-24 MED ORDER — MEPERIDINE HCL 25 MG/ML IJ SOLN: 6.2500 mg | INTRAMUSCULAR | Status: DC | PRN

## 2015-03-24 MED ORDER — TECHNETIUM TC 99M SULFUR COLLOID FILTERED
1.0000 | Freq: Once | INTRAVENOUS | Status: AC | PRN
  Administered 2015-03-24: 1 via INTRADERMAL

## 2015-03-24 MED ORDER — ONDANSETRON HCL 4 MG/2ML IJ SOLN
INTRAMUSCULAR | Status: AC
  Filled 2015-03-24: qty 2

## 2015-03-24 MED ORDER — FENTANYL CITRATE (PF) 100 MCG/2ML IJ SOLN
50.0000 ug | INTRAMUSCULAR | Status: DC | PRN
  Administered 2015-03-24 (×2): 100 ug via INTRAVENOUS

## 2015-03-24 MED ORDER — PHENYLEPHRINE HCL 10 MG/ML IJ SOLN
INTRAMUSCULAR | Status: DC | PRN
  Administered 2015-03-24: 80 ug via INTRAVENOUS

## 2015-03-24 MED ORDER — OXYCODONE HCL 5 MG PO TABS: 5.0000 mg | ORAL_TABLET | Freq: Once | ORAL | Status: DC | PRN

## 2015-03-24 MED ORDER — HYDROMORPHONE HCL 1 MG/ML IJ SOLN
INTRAMUSCULAR | Status: AC
  Filled 2015-03-24: qty 1

## 2015-03-24 SURGICAL SUPPLY — 63 items
APPLIER CLIP 9.375 MED OPEN (MISCELLANEOUS) ×3
APR CLP MED 9.3 20 MLT OPN (MISCELLANEOUS) ×1
BINDER BREAST LRG (GAUZE/BANDAGES/DRESSINGS) ×2 IMPLANT
BINDER BREAST MEDIUM (GAUZE/BANDAGES/DRESSINGS) IMPLANT
BINDER BREAST XLRG (GAUZE/BANDAGES/DRESSINGS) IMPLANT
BINDER BREAST XXLRG (GAUZE/BANDAGES/DRESSINGS) IMPLANT
BLADE SURG 15 STRL LF DISP TIS (BLADE) ×1 IMPLANT
BLADE SURG 15 STRL SS (BLADE) ×3
CANISTER SUC SOCK COL 7IN (MISCELLANEOUS) IMPLANT
CANISTER SUCT 1200ML W/VALVE (MISCELLANEOUS) IMPLANT
CHLORAPREP W/TINT 26ML (MISCELLANEOUS) ×3 IMPLANT
CLIP APPLIE 9.375 MED OPEN (MISCELLANEOUS) ×1 IMPLANT
CLOSURE WOUND 1/2 X4 (GAUZE/BANDAGES/DRESSINGS) ×1
COVER BACK TABLE 60X90IN (DRAPES) ×3 IMPLANT
COVER MAYO STAND STRL (DRAPES) ×3 IMPLANT
COVER PROBE W GEL 5X96 (DRAPES) ×3 IMPLANT
DECANTER SPIKE VIAL GLASS SM (MISCELLANEOUS) IMPLANT
DEVICE DUBIN W/COMP PLATE 8390 (MISCELLANEOUS) ×3 IMPLANT
DRAPE LAPAROSCOPIC ABDOMINAL (DRAPES) ×3 IMPLANT
DRAPE UTILITY XL STRL (DRAPES) ×3 IMPLANT
ELECT COATED BLADE 2.86 ST (ELECTRODE) ×3 IMPLANT
ELECT REM PT RETURN 9FT ADLT (ELECTROSURGICAL) ×3
ELECTRODE REM PT RTRN 9FT ADLT (ELECTROSURGICAL) ×1 IMPLANT
GLOVE BIO SURGEON STRL SZ 6.5 (GLOVE) ×1 IMPLANT
GLOVE BIO SURGEON STRL SZ7 (GLOVE) ×6 IMPLANT
GLOVE BIO SURGEONS STRL SZ 6.5 (GLOVE) ×1
GLOVE BIOGEL PI IND STRL 7.0 (GLOVE) IMPLANT
GLOVE BIOGEL PI IND STRL 7.5 (GLOVE) ×1 IMPLANT
GLOVE BIOGEL PI INDICATOR 7.0 (GLOVE) ×2
GLOVE BIOGEL PI INDICATOR 7.5 (GLOVE) ×2
GOWN STRL REUS W/ TWL LRG LVL3 (GOWN DISPOSABLE) ×2 IMPLANT
GOWN STRL REUS W/TWL LRG LVL3 (GOWN DISPOSABLE) ×6
ILLUMINATOR WAVEGUIDE N/F (MISCELLANEOUS) ×2 IMPLANT
KIT MARKER MARGIN INK (KITS) ×3 IMPLANT
LIGHT WAVEGUIDE WIDE FLAT (MISCELLANEOUS) IMPLANT
LIQUID BAND (GAUZE/BANDAGES/DRESSINGS) ×3 IMPLANT
NDL HYPO 25X1 1.5 SAFETY (NEEDLE) ×1 IMPLANT
NDL SAFETY ECLIPSE 18X1.5 (NEEDLE) IMPLANT
NEEDLE HYPO 18GX1.5 SHARP (NEEDLE)
NEEDLE HYPO 25X1 1.5 SAFETY (NEEDLE) ×3 IMPLANT
NS IRRIG 1000ML POUR BTL (IV SOLUTION) IMPLANT
PACK BASIN DAY SURGERY FS (CUSTOM PROCEDURE TRAY) ×3 IMPLANT
PENCIL BUTTON HOLSTER BLD 10FT (ELECTRODE) ×3 IMPLANT
SHEET MEDIUM DRAPE 40X70 STRL (DRAPES) IMPLANT
SLEEVE SCD COMPRESS KNEE MED (MISCELLANEOUS) ×3 IMPLANT
SPONGE LAP 4X18 X RAY DECT (DISPOSABLE) ×5 IMPLANT
STOCKINETTE IMPERVIOUS LG (DRAPES) IMPLANT
STRIP CLOSURE SKIN 1/2X4 (GAUZE/BANDAGES/DRESSINGS) ×2 IMPLANT
SUT ETHILON 2 0 FS 18 (SUTURE) IMPLANT
SUT MNCRL AB 4-0 PS2 18 (SUTURE) ×3 IMPLANT
SUT MON AB 5-0 PS2 18 (SUTURE) ×2 IMPLANT
SUT SILK 2 0 SH (SUTURE) IMPLANT
SUT VIC AB 2-0 SH 27 (SUTURE) ×3
SUT VIC AB 2-0 SH 27XBRD (SUTURE) ×1 IMPLANT
SUT VIC AB 3-0 SH 27 (SUTURE) ×3
SUT VIC AB 3-0 SH 27X BRD (SUTURE) ×1 IMPLANT
SUT VIC AB 5-0 PS2 18 (SUTURE) IMPLANT
SYR CONTROL 10ML LL (SYRINGE) ×3 IMPLANT
TOWEL OR 17X24 6PK STRL BLUE (TOWEL DISPOSABLE) ×3 IMPLANT
TOWEL OR NON WOVEN STRL DISP B (DISPOSABLE) ×1 IMPLANT
TUBE CONNECTING 20'X1/4 (TUBING)
TUBE CONNECTING 20X1/4 (TUBING) IMPLANT
YANKAUER SUCT BULB TIP NO VENT (SUCTIONS) IMPLANT

## 2015-03-24 NOTE — H&P (Signed)
50 yof who underwent screening mm that shows a left breast distortion. her density is c. this is uoq of left breast. on [place] she has spiculated mass at 1 oclock 6 cm from the nipple measuring 1.6x1.2x1 cm. [place] of left axilla is negative. she underwent core biopsy with clip placement that shows a low grade IDC that is er pos at 48, pr at 100, her2 negative, and Ki is 5%. there is dcis on core as well. she has no discharge or mass. she has no family history and no prior history of any breast issues either. She works at [employer] here in [place].   Other Problems Conni Slipper, RN; 03/15/2015 4:44 PM) No pertinent past medical history  Past Surgical History Conni Slipper, RN; 03/15/2015 4:44 PM) Breast Biopsy Left. Oral Surgery  Diagnostic Studies History Conni Slipper, RN; 03/15/2015 4:44 PM) Colonoscopy >10 years ago Mammogram within last year Pap Smear 1-5 years ago  Medication History Conni Slipper, RN; 03/15/2015 4:44 PM) No Current Medications Medications Reconciled  Social History Conni Slipper, RN; 03/15/2015 4:44 PM) Alcohol use Occasional alcohol use. Caffeine use Coffee. No drug use Tobacco use Never smoker.  Family History Conni Slipper, RN; 03/15/2015 4:44 PM) Alcohol Abuse Mother. Arthritis Mother. Heart Disease Mother. Heart disease in female family member before age 21  Pregnancy / Birth History Conni Slipper, RN; 03/15/2015 4:44 PM) Age at menarche 50 years. Gravida 1 Maternal age 48-30 Para 1 Regular periods  Review of Systems Conni Slipper RN; 03/15/2015 4:44 PM) General Not Present- Appetite Loss, Chills, Fatigue, Fever, Night Sweats, Weight Gain and Weight Loss. Skin Not Present- Change in Wart/Mole, Dryness, Hives, Jaundice, New Lesions, Non-Healing Wounds, Rash and Ulcer. HEENT Not Present- Earache, Hearing Loss, Hoarseness, Nose Bleed, Oral Ulcers, Ringing in the Ears, Seasonal Allergies, Sinus Pain, Sore Throat, Visual Disturbances, Wears  glasses/contact lenses and Yellow Eyes. Respiratory Not Present- Bloody sputum, Chronic Cough, Difficulty Breathing, Snoring and Wheezing. Breast Not Present- Breast Mass, Breast Pain, Nipple Discharge and Skin Changes. Cardiovascular Not Present- Chest Pain, Difficulty Breathing Lying Down, Leg Cramps, Palpitations, Rapid Heart Rate, Shortness of Breath and Swelling of Extremities. Gastrointestinal Not Present- Abdominal Pain, Bloating, Bloody Stool, Change in Bowel Habits, Chronic diarrhea, Constipation, Difficulty Swallowing, Excessive gas, Gets full quickly at meals, Hemorrhoids, Indigestion, Nausea, Rectal Pain and Vomiting. Female Genitourinary Not Present- Frequency, Nocturia, Painful Urination, Pelvic Pain and Urgency. Musculoskeletal Not Present- Back Pain, Joint Pain, Joint Stiffness, Muscle Pain, Muscle Weakness and Swelling of Extremities. Neurological Not Present- Decreased Memory, Fainting, Headaches, Numbness, Seizures, Tingling, Tremor, Trouble walking and Weakness. Psychiatric Not Present- Anxiety, Bipolar, Change in Sleep Pattern, Depression, Fearful and Frequent crying. Endocrine Not Present- Cold Intolerance, Excessive Hunger, Hair Changes, Heat Intolerance, Hot flashes and New Diabetes. Hematology Not Present- Easy Bruising, Excessive bleeding, Gland problems, HIV and Persistent Infections.   Physical Exam Rolm Bookbinder MD; 03/17/2015 8:40 AM) General Mental Status-Alert. Orientation-Oriented X3. Chest and Lung Exam Chest and lung exam reveals -on auscultation, normal breath sounds, no adventitious sounds and normal vocal resonance. Breast Nipples-No Discharge. Breast Lump-No Palpable Breast Mass. Cardiovascular Cardiovascular examination reveals -normal heart sounds, regular rate and rhythm with no murmurs. Lymphatic Head & Neck General Head & Neck Lymphatics: Bilateral - Description - Normal. Axillary General Axillary Region: Bilateral - Description  - Normal. Note: no Campbell adenopathy   Assessment & Plan Rolm Bookbinder MD; 03/17/2015 8:54 AM) BREAST CANCER OF UPPER-OUTER QUADRANT OF LEFT FEMALE BREAST (C50.412) Story: Left breast seed guided lumpectomy, left axillary  sentinel node biopsy We discussed the staging and pathophysiology of breast cancer. We discussed all of the different options for treatment for breast cancer including surgery, chemotherapy, radiation therapy, Herceptin, and antiestrogen therapy. I dont think she needs mri. We discussed a sentinel lymph node biopsy as she does not appear to having lymph node involvement right now. We discussed the performance of that with injection of radioactive tracer. We discussed that she would have an incision underneath her axillary hairline. We discussed that there is a chance of having a positive node with a sentinel lymph node biopsy and we will await the permanent pathology to make any other first further decisions in terms of her treatment. We discussed up to a 5% risk lifetime of chronic shoulder pain as well as lymphedema associated with a sentinel lymph node biopsy. We discussed the options for treatment of the breast cancer which included lumpectomy versus a mastectomy. We discussed the performance of the lumpectomy with radioactive seed placement. We discussed a 5% chance of a positive margin requiring reexcision in the operating room. We also discussed that she will need radiation therapy (this is usually 5-7 weeks) if she undergoes lumpectomy. We discussed the mastectomy (removal of whole breast) and the postoperative care for that as well. Mastectomy can be followed by reconstruction. The decision for lumpectomy vs mastectomy has no impact on decision for chemotherapy. Most mastectomy patients will not need radiation therapy. We discussed that there is no difference in her survival whether she undergoes lumpectomy with radiation therapy or antiestrogen therapy versus a mastectomy. There  is also no real difference between her recurrence in the breast. There is also no real difference between her recurrence in the breast. We discussed the risks of operation including bleeding, infection, possible reoperation

## 2015-03-24 NOTE — Interval H&P Note (Signed)
History and Physical Interval Note:  03/24/2015 1:19 PM  Bethany Valdez  has presented today for surgery, with the diagnosis of left breast cancer  The various methods of treatment have been discussed with the patient and family. After consideration of risks, benefits and other options for treatment, the patient has consented to  Procedure(s): LEFT BREAST SEED GUIDED LUMPECTOMY WITH LEFT AXILLARY SENTINEL NODE BIOPSY (Left) as a surgical intervention .  The patient's history has been reviewed, patient examined, no change in status, stable for surgery.  I have reviewed the patient's chart and labs.  Questions were answered to the patient's satisfaction.     Philander Ake

## 2015-03-24 NOTE — Op Note (Signed)
Preoperative diagnosis: stage I left breast cancer Postoperative diagnosis: same as above Procedure: 1. Left breast seed guided lumpectomy 2. Left axillary sn biopsy Surgeon Dr Serita Grammes Anes general with pectoral block EBL: minimal Comps none Specimen:  1. Left breast marked with paint 2. Sentinel nodes with highest count of 309 3. Additional medial and posterior margins marked short superior, long lateral, double deep Sponge count correct at completion dispo to recovery stable  Indications: this is a 77 yof who has clinical stage I left breast cancer. We discussed proceeding with lumpectomy/sn biopsy. She had radioactive seed placed prior to beginning. I had these mm in the OR.   Procedure: After informed consent was obtained the patient was taken to the OR. She was injected with technetium in the standard periareolar fashion. She underwent a pectoral block. She was given ancef. SCDs were in place. She was prepped and draped in the standard sterile surgical fashion. A timeout was performed. I then made a perareolar incision. I used the lighted retractors to tunnel to the lesion superolaterally  I then removed the seed with an attempt to get a clear margin.  I did confirm removal of the clip and seed with mammography.I removed some additional medial and posterior tissue to clear this margin as it appeared close.the posterior margin is the muscle. I placed clips in the cavity. .  I closed the deep tissue with 2-0 vicryl. The superficial tissue was closedwith 3-0 vicryl and the skin was then closed with 5-0 monocryl Glue and steristrips were applied. I made an axillary incision.  I went through the axillary fascia. I was able to locate what appeared to be a sentinel node with highest count listed above. There were no more palpable or radioactive nodes present. I obtained hemostasis. I then closed the fascia with 2-0 vicryl The skin was closed with 3-0 vicryl and 4-0  monocryl. Glue and steristrips were placed A breast binder was placed. She was extubated and transferred to recovery stable

## 2015-03-24 NOTE — Anesthesia Postprocedure Evaluation (Signed)
Anesthesia Post Note  Patient: Bethany Valdez  Procedure(s) Performed: Procedure(s) (LRB): LEFT BREAST SEED GUIDED LUMPECTOMY WITH LEFT AXILLARY SENTINEL NODE BIOPSY (Left)  Patient location during evaluation: PACU Anesthesia Type: General Level of consciousness: awake and alert Pain management: pain level controlled Vital Signs Assessment: post-procedure vital signs reviewed and stable Respiratory status: spontaneous breathing, nonlabored ventilation and respiratory function stable Cardiovascular status: blood pressure returned to baseline and stable Postop Assessment: no signs of nausea or vomiting Anesthetic complications: no    Last Vitals:  Filed Vitals:   03/24/15 1500 03/24/15 1515  BP: 116/72 117/78  Pulse: 95 88  Temp:    Resp: 17 17    Last Pain:  Filed Vitals:   03/24/15 1524  PainSc: 3                  Leeta Grimme A

## 2015-03-24 NOTE — Progress Notes (Signed)
Assisted Dr. Crews with left, ultrasound guided, pectoralis block. Side rails up, monitors on throughout procedure. See vital signs in flow sheet. Tolerated Procedure well. 

## 2015-03-24 NOTE — Progress Notes (Signed)
Radiology staff performed nuc med inj. Pt tol well with sedation. VSS (see flowsheet).

## 2015-03-24 NOTE — Anesthesia Preprocedure Evaluation (Signed)

## 2015-03-24 NOTE — Discharge Instructions (Signed)
[facility] °Office Phone Number [phone] ° °POST OP INSTRUCTIONS ° °Always review your discharge instruction sheet given to you by the facility where your surgery was performed. ° °IF YOU HAVE DISABILITY OR FAMILY LEAVE FORMS, YOU MUST BRING THEM TO THE OFFICE FOR PROCESSING.  DO NOT GIVE THEM TO YOUR DOCTOR. ° °1. A prescription for pain medication may be given to you upon discharge.  Take your pain medication as prescribed, if needed.  If narcotic pain medicine is not needed, then you may take acetaminophen (Tylenol), naprosyn (Alleve) or ibuprofen (Advil) as needed. °2. Take your usually prescribed medications unless otherwise directed °3. If you need a refill on your pain medication, please contact your pharmacy.  They will contact our office to request authorization.  Prescriptions will not be filled after 5pm or on week-ends. °4. You should eat very light the first 24 hours after surgery, such as soup, crackers, pudding, etc.  Resume your normal diet the day after surgery. °5. Most patients will experience some swelling and bruising in the breast.  Ice packs and a good support bra will help.  Wear the breast binder provided or a sports bra for 72 hours day and night.  After that wear a sports bra during the day until you return to the office. Swelling and bruising can take several days to resolve.  °6. It is common to experience some constipation if taking pain medication after surgery.  Increasing fluid intake and taking a stool softener will usually help or prevent this problem from occurring.  A mild laxative (Milk of Magnesia or Miralax) should be taken according to package directions if there are no bowel movements after 48 hours. °7. Unless discharge instructions indicate otherwise, you may remove your bandages 48 hours after surgery and you may shower at that time.  You may have steri-strips (small skin tapes) in place directly over the incision.  These strips should be left on the  skin for 7-10 days and will come off on their own.  If your surgeon used skin glue on the incision, you may shower in 24 hours.  The glue will flake off over the next 2-3 weeks.  Any sutures or staples will be removed at the office during your follow-up visit. °8. ACTIVITIES:  You may resume regular daily activities (gradually increasing) beginning the next day.  Wearing a good support bra or sports bra minimizes pain and swelling.  You may have sexual intercourse when it is comfortable. °a. You may drive when you no longer are taking prescription pain medication, you can comfortably wear a seatbelt, and you can safely maneuver your car and apply brakes. °b. RETURN TO WORK:  ______________________________________________________________________________________ °9. You should see your doctor in the office for a follow-up appointment approximately two weeks after your surgery.  Your doctor’s nurse will typically make your follow-up appointment when she calls you with your pathology report.  Expect your pathology report 3-4 business days after your surgery.  You may call to check if you do not hear from us after three days. °10. OTHER INSTRUCTIONS: _______________________________________________________________________________________________ _____________________________________________________________________________________________________________________________________ °_____________________________________________________________________________________________________________________________________ °_____________________________________________________________________________________________________________________________________ ° °WHEN TO CALL DR WAKEFIELD: °1. Fever over 101.0 °2. Nausea and/or vomiting. °3. Extreme swelling or bruising. °4. Continued bleeding from incision. °5. Increased pain, redness, or drainage from the incision. ° °The clinic staff is available to answer your questions during regular  business hours.  Please don’t hesitate to call and ask to speak to one of the nurses for clinical concerns.  If   you have a medical emergency, go to the nearest emergency room or call 911.  A surgeon from [facility] is always on call at the hospital. ° °For further questions, please visit [email or web address] ° ° ° °Post Anesthesia Home Care Instructions ° °Activity: °Get plenty of rest for the remainder of the day. A responsible adult should stay with you for 24 hours following the procedure.  °For the next 24 hours, DO NOT: °-Drive a car °-Operate machinery °-Drink alcoholic beverages °-Take any medication unless instructed by your physician °-Make any legal decisions or sign important papers. ° °Meals: °Start with liquid foods such as gelatin or soup. Progress to regular foods as tolerated. Avoid greasy, spicy, heavy foods. If nausea and/or vomiting occur, drink only clear liquids until the nausea and/or vomiting subsides. Call your physician if vomiting continues. ° °Special Instructions/Symptoms: °Your throat may feel dry or sore from the anesthesia or the breathing tube placed in your throat during surgery. If this causes discomfort, gargle with warm salt water. The discomfort should disappear within 24 hours. ° °If you had a scopolamine patch placed behind your ear for the management of post- operative nausea and/or vomiting: ° °1. The medication in the patch is effective for 72 hours, after which it should be removed.  Wrap patch in a tissue and discard in the trash. Wash hands thoroughly with soap and water. °2. You may remove the patch earlier than 72 hours if you experience unpleasant side effects which may include dry mouth, dizziness or visual disturbances. °3. Avoid touching the patch. Wash your hands with soap and water after contact with the patch. °  ° °

## 2015-03-24 NOTE — Transfer of Care (Signed)
Immediate Anesthesia Transfer of Care Note  Patient: Bethany Valdez  Procedure(s) Performed: Procedure(s): LEFT BREAST SEED GUIDED LUMPECTOMY WITH LEFT AXILLARY SENTINEL NODE BIOPSY (Left)  Patient Location: PACU  Anesthesia Type:General  Level of Consciousness: sedated  Airway & Oxygen Therapy: Patient Spontanous Breathing and Patient connected to face mask oxygen  Post-op Assessment: Report given to RN and Post -op Vital signs reviewed and stable  Post vital signs: Reviewed and stable  Last Vitals:  Filed Vitals:   03/24/15 1325 03/24/15 1330  BP: 109/74 121/76  Pulse: 92 104  Temp:    Resp: 16 16    Complications: No apparent anesthesia complications

## 2015-03-24 NOTE — Anesthesia Procedure Notes (Addendum)
Anesthesia Regional Block:  Pectoralis block  Pre-Anesthetic Checklist: ,, timeout performed, Correct Patient, Correct Site, Correct Laterality, Correct Procedure, Correct Position, site marked, Risks and benefits discussed,  Surgical consent,  Pre-op evaluation,  At surgeon's request and post-op pain management  Laterality: Left and Upper  Prep: chloraprep       Needles:  Injection technique: Single-shot  Needle Type: Echogenic Needle     Needle Length: 9cm 9 cm Needle Gauge: 21 and 21 G    Additional Needles:  Procedures: ultrasound guided (picture in chart) Pectoralis block Narrative:  Start time: 03/24/2015 1:23 PM End time: 03/24/2015 1:28 PM Injection made incrementally with aspirations every 5 mL.  Performed by: Personally  Anesthesiologist: CREWS, DAVID   Procedure Name: LMA Insertion Date/Time: 03/24/2015 1:42 PM Performed by: Lieutenant Diego Pre-anesthesia Checklist: Patient identified, Emergency Drugs available, Suction available and Patient being monitored Patient Re-evaluated:Patient Re-evaluated prior to inductionOxygen Delivery Method: Circle System Utilized Preoxygenation: Pre-oxygenation with 100% oxygen Intubation Type: IV induction Ventilation: Mask ventilation without difficulty LMA: LMA inserted LMA Size: 4.0 Number of attempts: 1 Airway Equipment and Method: Bite block Placement Confirmation: positive ETCO2 and breath sounds checked- equal and bilateral Tube secured with: Tape Dental Injury: Teeth and Oropharynx as per pre-operative assessment       Left PEC image

## 2015-03-25 ENCOUNTER — Encounter (HOSPITAL_BASED_OUTPATIENT_CLINIC_OR_DEPARTMENT_OTHER): Payer: Self-pay | Admitting: General Surgery

## 2015-03-30 NOTE — Assessment & Plan Note (Signed)
Left Lumpectomy 03/24/15: IDC grade 1, 1.7 cm, 0/6 LN neg ER 90%, PR 100%, HER-2 negative ratio 1.35, Ki-67 5% T1C N0 (Stage 1A)  Pathology counseling: I discussed the final pathology report of the patient provided  a copy of this report. I discussed the margins as well as lymph node surgeries. We also discussed the final staging along with previously performed ER/PR and HER-2/neu testing.  Recommendations: 1. Oncotype DX testing to determine if chemotherapy would be of any benefit followed by 2. Adjuvant radiation therapy followed by 3. Adjuvant antiestrogen therapy

## 2015-03-31 ENCOUNTER — Ambulatory Visit (HOSPITAL_BASED_OUTPATIENT_CLINIC_OR_DEPARTMENT_OTHER): Payer: BLUE CROSS/BLUE SHIELD | Admitting: Hematology and Oncology

## 2015-03-31 ENCOUNTER — Encounter: Payer: Self-pay | Admitting: *Deleted

## 2015-03-31 ENCOUNTER — Encounter: Payer: Self-pay | Admitting: Hematology and Oncology

## 2015-03-31 VITALS — BP 124/71 | HR 74 | Temp 98.1°F | Resp 18 | Ht 62.0 in | Wt 148.1 lb

## 2015-03-31 DIAGNOSIS — C50412 Malignant neoplasm of upper-outer quadrant of left female breast: Secondary | ICD-10-CM | POA: Diagnosis not present

## 2015-03-31 DIAGNOSIS — Z170 Estrogen receptor positive status [ER+]: Secondary | ICD-10-CM | POA: Diagnosis not present

## 2015-03-31 NOTE — Progress Notes (Signed)
Ordered oncotype per Dr. Gudena.  Faxed PA to [employer] and faxed requisition to pathology and confirmed receipt. 

## 2015-03-31 NOTE — Progress Notes (Signed)
Patient Care Team: [name] Patient as PCP - General (General Practice) Sylvan Cheese, NP as Nurse Practitioner (Hematology and Oncology)  DIAGNOSIS: Breast cancer of upper-outer quadrant of left female breast [facility])   Staging form: Breast, AJCC 7th Edition     Clinical stage from 03/16/2015: Stage IA (T1c, N0, M0) - Unsigned       Staging comments: Staged at breast conference on 1.18.17  SUMMARY OF ONCOLOGIC HISTORY:   Breast cancer of upper-outer quadrant of left female breast ([place])   03/08/2015 Initial Diagnosis Left breast 1:00 biopsy: Invasive ductal carcinoma with DCIS, grade 1-2, ER 90%, PR 100%, HER-2 negative ratio 1.35, Ki-67 5%, 1.6 x 1.2 x 1 cm left breast distortion, T1 cN0 stage IA clinical stage   03/24/2015 Surgery Left Lumpectomy: IDC grade 1, 1.7 cm, 0/6 LN neg ER 90%, PR 100%, HER-2 negative ratio 1.35, Ki-67 5% T1C N0 (Stage 1A)   CHIEF COMPLIANT: follow-up after recent lumpectomy surgery  INTERVAL HISTORY: Bethany Valdez is a 50 year old with above-mentioned history of left breast cancer treated with lumpectomy and is here today to discuss the pathology report. She is recovering very well from the surgery standpoint. She does have soreness under the left arm.  REVIEW OF SYSTEMS:   Constitutional: Denies fevers, chills or abnormal weight loss Eyes: Denies blurriness of vision Ears, nose, mouth, throat, and face: Denies mucositis or sore throat Respiratory: Denies cough, dyspnea or wheezes Cardiovascular: Denies palpitation, chest discomfort Gastrointestinal:  Denies nausea, heartburn or change in bowel habits Skin: Denies abnormal skin rashes Lymphatics: Denies new lymphadenopathy or easy bruising Neurological:Denies numbness, tingling or new weaknesses Behavioral/Psych: Mood is stable, no new changes  Extremities: No lower extremity edema Breast: soreness from recent surgery All other systems were reviewed with the patient and are negative.  I have  reviewed the past medical history, past surgical history, social history and family history with the patient and they are unchanged from previous note.  ALLERGIES:  has No Known Allergies.  MEDICATIONS:  Current Outpatient Prescriptions  Medication Sig Dispense Refill  . Calcium Carbonate-Vitamin D (CALCIUM-VITAMIN D) 500-200 MG-UNIT tablet Take 1 tablet by mouth daily.    Marland Kitchen oxyCODONE-acetaminophen (PERCOCET) 10-325 MG tablet Take 1 tablet by mouth every 6 (six) hours as needed for pain. 20 tablet 0  . promethazine (PHENERGAN) 12.5 MG tablet Take 1 tablet (12.5 mg total) by mouth every 6 (six) hours as needed for nausea or vomiting. 15 tablet 0  . zolpidem (AMBIEN) 5 MG tablet Take 1 tablet (5 mg total) by mouth at bedtime as needed for sleep. 10 tablet 0   No current facility-administered medications for this visit.    PHYSICAL EXAMINATION: ECOG PERFORMANCE STATUS: 1 - Symptomatic but completely ambulatory  Filed Vitals:   03/31/15 1039  BP: 124/71  Pulse: 74  Temp: 98.1 F (36.7 C)  Resp: 18   Filed Weights   03/31/15 1039  Weight: 148 lb 1.6 oz (67.178 kg)    GENERAL:alert, no distress and comfortable SKIN: skin color, texture, turgor are normal, no rashes or significant lesions EYES: normal, Conjunctiva are pink and non-injected, sclera clear OROPHARYNX:no exudate, no erythema and lips, buccal mucosa, and tongue normal  NECK: supple, thyroid normal size, non-tender, without nodularity LYMPH:  no palpable lymphadenopathy in the cervical, axillary or inguinal LUNGS: clear to auscultation and percussion with normal breathing effort HEART: regular rate & rhythm and no murmurs and no lower extremity edema ABDOMEN:abdomen soft, non-tender and normal bowel sounds MUSCULOSKELETAL:no  cyanosis of digits and no clubbing  NEURO: alert & oriented x 3 with fluent speech, no focal motor/sensory deficits EXTREMITIES: No lower extremity edema  LABORATORY DATA:  I have reviewed the  data as listed   Chemistry      Component Value Date/Time   NA 142 03/16/2015 1235   K 3.9 03/16/2015 1235   CO2 27 03/16/2015 1235   BUN 9.3 03/16/2015 1235   CREATININE 0.8 03/16/2015 1235      Component Value Date/Time   CALCIUM 9.3 03/16/2015 1235   ALKPHOS 59 03/16/2015 1235   AST 16 03/16/2015 1235   ALT 13 03/16/2015 1235   BILITOT 1.22* 03/16/2015 1235     Lab Results  Component Value Date   WBC 10.7* 03/16/2015   HGB 14.4 03/16/2015   HCT 42.7 03/16/2015   MCV 99.4 03/16/2015   PLT 263 03/16/2015   NEUTROABS 7.0* 03/16/2015   ASSESSMENT & PLAN:  Breast cancer of upper-outer quadrant of left female breast ([place]) Left Lumpectomy 03/24/15: IDC grade 1, 1.7 cm, 0/6 LN neg ER 90%, PR 100%, HER-2 negative ratio 1.35, Ki-67 5% T1C N0 (Stage 1A)  Pathology counseling: I discussed the final pathology report of the patient provided  a copy of this report. I discussed the margins as well as lymph node surgeries. We also discussed the final staging along with previously performed ER/PR and HER-2/neu testing.  Recommendations: 1. Oncotype DX testing to determine if chemotherapy would be of any benefit followed by 2. Adjuvant radiation therapy followed by 3. Adjuvant antiestrogen therapy  Follow-up pending on Oncotype DX test result No orders of the defined types were placed in this encounter.   The patient has a good understanding of the overall plan. she agrees with it. she will call with any problems that may develop before the next visit here.   Rulon Eisenmenger, MD 03/31/2015

## 2015-04-01 ENCOUNTER — Encounter: Payer: Self-pay | Admitting: Hematology and Oncology

## 2015-04-01 NOTE — Progress Notes (Signed)
I faxed notes/labs to [employer] [phone]--sharepoint noted

## 2015-04-07 ENCOUNTER — Encounter: Payer: Self-pay | Admitting: Hematology and Oncology

## 2015-04-07 NOTE — Progress Notes (Signed)
recd fmla form via fax

## 2015-04-08 ENCOUNTER — Encounter: Payer: Self-pay | Admitting: Hematology and Oncology

## 2015-04-08 NOTE — Progress Notes (Signed)
Placed for dr. Lindi Adie to sign

## 2015-04-13 ENCOUNTER — Telehealth: Payer: Self-pay

## 2015-04-13 NOTE — Telephone Encounter (Signed)
Pt LM - requesting OncDx results.  Routed to navigators.

## 2015-04-15 ENCOUNTER — Encounter: Payer: Self-pay | Admitting: Hematology and Oncology

## 2015-04-15 NOTE — Progress Notes (Signed)
placed for dr. Lindi Adie to sign. faxed [employer] [phone] and copy for patient records

## 2015-04-19 ENCOUNTER — Telehealth: Payer: Self-pay

## 2015-04-19 ENCOUNTER — Telehealth: Payer: Self-pay | Admitting: *Deleted

## 2015-04-19 NOTE — Telephone Encounter (Signed)
Received oncotype score of 13/8%. Spoke to pt concerning results. Informed it was low risk and no need for chemo.  Referral sent to Dr. Sondra Come for appt and xrt.

## 2015-04-19 NOTE — Telephone Encounter (Signed)
Pt left msg asking if OncDx results are in.  Routed to navigators

## 2015-04-20 ENCOUNTER — Encounter (HOSPITAL_COMMUNITY): Payer: Self-pay

## 2015-04-22 NOTE — Progress Notes (Signed)
Location of Breast Cancer: Breast cancer of upper-outer quadrant of left female breast   Histology per Pathology Report:   03/24/15 Diagnosis 1. Breast, lumpectomy, left - INVASIVE DUCTAL CARCINOMA, SEE COMMENT. - INVASIVE TUMOR IS 1.5 MM FROM THE NEAREST MARGIN (LATERAL). - LOBULAR NEOPLASIA (ATYPICAL LOBULAR HYPERPLASIA AND LOBULAR CARCINOMA IN SITU) - PREVIOUS BIOPSY SITE TISSUE CHANGES - SEE TUMOR SYNOPTIC TEMPLATE BELOW. 2. Lymph node, sentinel, biopsy, left - ONE LYMPH NODE, NEGATIVE FOR TUMOR (0/1). 3. Lymph node, sentinel, biopsy, left - ONE LYMPH NODE, NEGATIVE FOR TUMOR (0/1). 4. Lymph node, sentinel, biopsy, left - ONE LYMPH NODE, NEGATIVE FOR TUMOR (0/1). 5. Lymph node, sentinel, biopsy, left - ONE LYMPH NODE, NEGATIVE FOR TUMOR (0/1). 6. Lymph node, sentinel, biopsy, left - ONE LYMPH NODE, NEGATIVE FOR TUMOR (0/1). 7. Lymph node, sentinel, biopsy, left - ONE LYMPH NODE, NEGATIVE FOR TUMOR (0/1). 8. Breast, excision, left additional medial margin - BENIGN BREAST TISSUE, SEE COMMENT. - NEGATIVE FOR ATYPIA AND MALIGNANCY. - MICROCALCIFICATIONS PRESENT. - SURGICAL MARGINS, NEGATIVE FOR ATYPIA AND MALIGNANCY. 9. Breast, excision, left additional posterior margin - BENIGN FIBROFATTY MAMMARY SOFT TISSUE, SEE COMMENT. - NEGATIVE FOR ATYPIA AND MALIGNANCY. - SURGICAL MARGINS, NEGATIVE FOR ATYPIA OR MALIGNANCY.  03/08/15 Diagnosis Breast, left, needle core biopsy, 1:00 o'clock - INVASIVE DUCTAL CARCINOMA. - DUCTAL CARCINOMA IN SITU. - SEE COMMENT.  Receptor Status: ER(90%+), PR (100%+), Her2-neu (negative)  Did patient present with symptoms (if so, please note symptoms) or was this found on screening mammography?: abnormal mammogram  Past/Anticipated interventions by surgeon, if any: 03/24/15 - Procedure: LEFT BREAST SEED GUIDED LUMPECTOMY WITH LEFT AXILLARY SENTINEL NODE BIOPSY;  Surgeon: Rolm Bookbinder, MD;  Location: [place] [facility];  Service: General;   Laterality: Left;  Past/Anticipated interventions by medical oncology, if any: Dr. Lindi Adie recommends "Oncotype DX testing to determine if chemotherapy would be of any benefit followed by adjuvant radiation therapy followed by adjuvant antiestrogen therapy."  Lymphedema issues, if any:  Minimal swelling in Left upper ar,   Pain issues, if any:  None Presently   OB/Gyn history: She menarched at age of 50. She had one pregnancy, her first child was born at age 50. G1, P1, She has not received birth control pills. She was never exposed to fertility medications or hormone replacement therapy. She has no family history of Breast/GYN/GI cancer  SAFETY ISSUES:  Prior radiation? No  Pacemaker/ICD? No  Possible current pregnancy? No  Is the patient on methotrexate? No  Current Complaints / other details:

## 2015-04-25 ENCOUNTER — Encounter: Payer: Self-pay | Admitting: Hematology and Oncology

## 2015-04-25 NOTE — Progress Notes (Signed)
Advised [employer] via fax no treatment--chemo or radiation has been scheduled as of today

## 2015-04-27 ENCOUNTER — Ambulatory Visit
Admission: RE | Admit: 2015-04-27 | Discharge: 2015-04-27 | Disposition: A | Discharge status: Home / Self Care | Payer: BLUE CROSS/BLUE SHIELD | Source: Ambulatory Visit | Attending: Radiation Oncology | Admitting: Radiation Oncology

## 2015-04-27 ENCOUNTER — Encounter: Payer: Self-pay | Admitting: Radiation Oncology

## 2015-04-27 VITALS — BP 113/69 | HR 72 | Temp 98.5°F | Ht 62.0 in | Wt 147.7 lb

## 2015-04-27 DIAGNOSIS — C50412 Malignant neoplasm of upper-outer quadrant of left female breast: Secondary | ICD-10-CM | POA: Diagnosis present

## 2015-04-27 DIAGNOSIS — Z170 Estrogen receptor positive status [ER+]: Secondary | ICD-10-CM | POA: Diagnosis not present

## 2015-04-27 DIAGNOSIS — Z510 Encounter for antineoplastic radiation therapy: Secondary | ICD-10-CM | POA: Diagnosis not present

## 2015-04-27 NOTE — Addendum Note (Signed)
Encounter addended by: Jacqulyn Liner, RN on: 04/27/2015  4:10 PM<BR>     Documentation filed: Charges VN

## 2015-04-27 NOTE — Progress Notes (Signed)
  Radiation Oncology         (336) [phone] ________________________________  Name: Bethany Valdez MRN: [id number]  Date: 04/27/2015  DOB: 1965-07-24  Follow-Up Visit Note  CC: [name] Patient  Rolm Bookbinder, MD   Diagnosis:    stage TIc, N0 Left invasive and in situ ductal carcinoma, ER 90%, PR 100%, Her2-neu negative with a 1.35 ratio, and KI67 5%.  Narrative:  The patient returns today for routine follow-up. She had a lumpectomy and SN procedure  of the left breast 03/24/2015, revealing invasive ductal carcinoma and 0/6 positive lymph nodes with negative margins. She denies any pain currently. She has already started going back to work.  ALLERGIES:  has No Known Allergies.  Meds: Current Outpatient Prescriptions  Medication Sig Dispense Refill  . Calcium Carbonate-Vitamin D (CALCIUM-VITAMIN D) 500-200 MG-UNIT tablet Take 1 tablet by mouth daily.    Marland Kitchen zolpidem (AMBIEN) 5 MG tablet Take 1 tablet (5 mg total) by mouth at bedtime as needed for sleep. (Patient not taking: Reported on 04/27/2015) 10 tablet 0   No current facility-administered medications for this encounter.    Physical Findings: The patient is in no acute distress. Patient is alert and oriented.  height is '5\' 2"'$  (1.575 m) and weight is 147 lb 11.2 oz (66.996 kg). Her temperature is 98.5 F (36.9 C). Her blood pressure is 113/69 and her pulse is 72. .  No significant changes. The lungs are clear to auscultation. The heart has a regular rhythm and rate. No palpable subclavicular or axillary adenopathy. Well healed scar in the periareolar area of the breast. Separate scar on the axilla from sentinel node procedure. Both have healed well.  Lab Findings: Lab Results  Component Value Date   WBC 10.7* 03/16/2015   HGB 14.4 03/16/2015   HCT 42.7 03/16/2015   MCV 99.4 03/16/2015   PLT 263 03/16/2015    Radiographic Findings: No results found.  Impression:  She is doing well since I last saw her in multidisciplinary  clinic 03/16/2015. Per Dr. Lindi Adie, she will not need chemotherapy. She is a good candidate for radiation post-lumpectomy.  Plan:  I spoke to the patient today regarding her diagnosis and options for treatment. We discussed the process of simulation and the placement tattoos. We discussed 6 weeks of treatment as an outpatient. We discussed the possibility of asymptomatic lung damage. We discussed the low likelihood of secondary malignancies. We discussed the possible side effects including but not limited to skin redness, fatigue, permanent skin darkening, and breast swelling.  Her planning appointment is schedule 04/28/2015 at 1 pm.  ____________________________________  Blair Promise, PhD, MD    This document serves as a record of services personally performed by Gery Pray, MD. It was created on his behalf by Lendon Collar, a trained medical scribe. The creation of this record is based on the scribe's personal observations and the provider's statements to them. This document has been checked and approved by the attending provider.

## 2015-04-27 NOTE — Progress Notes (Signed)
Please see the Nurse Progress Note in the MD Initial Consult Encounter for this patient. 

## 2015-04-28 ENCOUNTER — Ambulatory Visit
Admission: RE | Admit: 2015-04-28 | Discharge: 2015-04-28 | Disposition: A | Discharge status: Home / Self Care | Payer: BLUE CROSS/BLUE SHIELD | Source: Ambulatory Visit | Attending: Radiation Oncology | Admitting: Radiation Oncology

## 2015-04-28 DIAGNOSIS — C50412 Malignant neoplasm of upper-outer quadrant of left female breast: Secondary | ICD-10-CM

## 2015-04-28 DIAGNOSIS — Z510 Encounter for antineoplastic radiation therapy: Secondary | ICD-10-CM | POA: Diagnosis not present

## 2015-04-29 ENCOUNTER — Encounter: Payer: Self-pay | Admitting: *Deleted

## 2015-04-29 ENCOUNTER — Telehealth: Payer: Self-pay | Admitting: Hematology and Oncology

## 2015-04-29 NOTE — Telephone Encounter (Signed)
lvm for pt regarding to May appt... °

## 2015-04-30 NOTE — Progress Notes (Signed)
  Radiation Oncology         (336) [phone] ________________________________  Name: Bethany Valdez MRN: [id number]  Date: 04/28/2015  DOB: [birth year]  SIMULATION AND TREATMENT PLANNING NOTE    ICD-9-CM ICD-10-CM   1. Breast cancer of upper-outer quadrant of left female breast ([place]) 174.4 C50.412     DIAGNOSIS:  stage TIc, N0, Left invasive and in situ ductal carcinoma, ER 90%, PR 100%, Her2-neu negative with a HER2 neu 1.35 ratio, and KI67 5%.  NARRATIVE:  The patient was brought to the [place].  Identity was confirmed.  All relevant records and images related to the planned course of therapy were reviewed.  The patient freely provided informed written consent to proceed with treatment after reviewing the details related to the planned course of therapy. The consent form was witnessed and verified by the simulation staff.  Then, the patient was set-up in a stable reproducible  supine position for radiation therapy.  CT images were obtained.  Surface markings were placed.  The CT images were loaded into the planning software.  Then the target and avoidance structures were contoured.  Treatment planning then occurred.  The radiation prescription was entered and confirmed.  Then, I designed and supervised the construction of a total of 3 medically necessary complex treatment devices.  I have requested : 3D Simulation  I have requested a DVH of the following structures: heart, lungs, lumpectomy cavity.  I have ordered:dose calc.  PLAN:  The patient will receive 50.4 Gy in 28 fractions followed by a boost to the lumpectomy cavity of 12 gray and a cumulative dose of 62.4 gray.  ________________________________  -----------------------------------  Blair Promise, PhD, MD

## 2015-05-03 ENCOUNTER — Ambulatory Visit: Payer: BLUE CROSS/BLUE SHIELD | Admitting: Radiation Oncology

## 2015-05-03 DIAGNOSIS — Z510 Encounter for antineoplastic radiation therapy: Secondary | ICD-10-CM | POA: Diagnosis not present

## 2015-05-05 ENCOUNTER — Ambulatory Visit
Admission: RE | Admit: 2015-05-05 | Discharge: 2015-05-05 | Disposition: A | Discharge status: Home / Self Care | Payer: BLUE CROSS/BLUE SHIELD | Source: Ambulatory Visit | Attending: Radiation Oncology | Admitting: Radiation Oncology

## 2015-05-05 ENCOUNTER — Encounter: Payer: Self-pay | Admitting: Radiation Oncology

## 2015-05-05 DIAGNOSIS — C50412 Malignant neoplasm of upper-outer quadrant of left female breast: Secondary | ICD-10-CM

## 2015-05-05 DIAGNOSIS — Z510 Encounter for antineoplastic radiation therapy: Secondary | ICD-10-CM | POA: Diagnosis not present

## 2015-05-05 NOTE — Progress Notes (Signed)
Emailed meredith h about calling [employer] back with treatment info [phone].

## 2015-05-05 NOTE — Progress Notes (Signed)
  Radiation Oncology         [phone] ________________________________  Name: Bethany Valdez MRN: [id number]  Date: 05/05/2015  DOB: June 21, 1965  Simulation Verification Note    ICD-9-CM ICD-10-CM   1. Breast cancer of upper-outer quadrant of left female breast ([place]) 174.4 C50.412     Status: outpatient  NARRATIVE: The patient was brought to the treatment unit and placed in the planned treatment position. The clinical setup was verified. Then port films were obtained and uploaded to the radiation oncology medical record software.  The treatment beams were carefully compared against the planned radiation fields. The position location and shape of the radiation fields was reviewed. They targeted volume of tissue appears to be appropriately covered by the radiation beams. Organs at risk appear to be excluded as planned.  Based on my personal review, I approved the simulation verification. The patient's treatment will proceed as planned.  -----------------------------------  Blair Promise, PhD, MD

## 2015-05-09 ENCOUNTER — Ambulatory Visit
Admission: RE | Admit: 2015-05-09 | Discharge: 2015-05-09 | Disposition: A | Discharge status: Home / Self Care | Payer: BLUE CROSS/BLUE SHIELD | Source: Ambulatory Visit | Attending: Radiation Oncology | Admitting: Radiation Oncology

## 2015-05-09 DIAGNOSIS — Z510 Encounter for antineoplastic radiation therapy: Secondary | ICD-10-CM | POA: Diagnosis not present

## 2015-05-09 DIAGNOSIS — C50412 Malignant neoplasm of upper-outer quadrant of left female breast: Secondary | ICD-10-CM

## 2015-05-09 MED ORDER — ALRA NON-METALLIC DEODORANT (RAD-ONC)
1.0000 "application " | Freq: Once | TOPICAL | Status: AC
  Administered 2015-05-09: 1 via TOPICAL

## 2015-05-09 MED ORDER — RADIAPLEXRX EX GEL
Freq: Once | CUTANEOUS | Status: AC
  Administered 2015-05-09: 12:00:00 via TOPICAL

## 2015-05-09 NOTE — Progress Notes (Signed)
Pt here for patient teaching.  Pt given Radiation and You booklet, skin care instructions, Alra deodorant and Radiaplex gel. Pt reports they have not watched the Radiation Therapy Education video and has been given the link to watch at home.  Reviewed areas of pertinence such as fatigue, skin changes, breast tenderness and breast swelling . Pt able to give teach back of to pat skin and use unscented/gentle soap,apply Radiaplex bid, avoid applying anything to skin within 4 hours of treatment and to use an electric razor if they must shave. Pt demonstrated understanding and verbalizes understanding of information given and will contact nursing with any questions or concerns.     [email or web address]

## 2015-05-10 ENCOUNTER — Encounter: Payer: Self-pay | Admitting: Radiation Oncology

## 2015-05-10 ENCOUNTER — Ambulatory Visit
Admission: RE | Admit: 2015-05-10 | Discharge: 2015-05-10 | Disposition: A | Discharge status: Home / Self Care | Payer: BLUE CROSS/BLUE SHIELD | Source: Ambulatory Visit | Attending: Radiation Oncology | Admitting: Radiation Oncology

## 2015-05-10 VITALS — BP 115/70 | HR 76 | Temp 98.0°F | Ht 62.0 in | Wt 147.9 lb

## 2015-05-10 DIAGNOSIS — C50412 Malignant neoplasm of upper-outer quadrant of left female breast: Secondary | ICD-10-CM

## 2015-05-10 DIAGNOSIS — Z510 Encounter for antineoplastic radiation therapy: Secondary | ICD-10-CM | POA: Diagnosis not present

## 2015-05-10 NOTE — Progress Notes (Signed)
  Radiation Oncology         [phone]) [phone] ________________________________  Name: Bethany Valdez MRN: [id number]  Date: 05/10/2015  DOB: February 18, 1966  Weekly Radiation Therapy Management    ICD-9-CM ICD-10-CM   1. Breast cancer of upper-outer quadrant of left female breast ([facility]) 174.4 C50.412      Current Dose: 3.6 Gy     Planned Dose:  62.4 Gy  Narrative . . . . . . . . The patient presents for routine under treatment assessment.                                   The patient is without complaint. No side effects thus far with her treatments. She continues to work full-time at this point.                                 Set-up films were reviewed.                                 The chart was checked. Physical Findings. . .  height is 5\' 2"  (1.575 m) and weight is 147 lb 14.4 oz (67.087 kg). Her temperature is 98 F (36.7 C). Her blood pressure is 115/70 and her pulse is 76. . The lungs are clear. The heart has regular rhythm and rate. Left breast area shows no appreciable radiation reaction at this time. Impression . . . . . . . The patient is tolerating radiation. Plan . . . . . . . . . . . . Continue treatment as planned.  ________________________________   Blair Promise, PhD, MD

## 2015-05-10 NOTE — Progress Notes (Signed)
Bethany Valdez is here for her 2nd fraction of radiation to her Left Breast. She denies pain or fatigue. She has no skin changes to her Left breast. She started using the radiaplex cream last night. She has no other concerns.   BP 115/70 mmHg  Pulse 76  Temp(Src) 98 F (36.7 C)  Ht 5\' 2"  (1.575 m)  Wt 147 lb 14.4 oz (67.087 kg)  BMI 27.04 kg/m2

## 2015-05-11 ENCOUNTER — Ambulatory Visit
Admission: RE | Admit: 2015-05-11 | Discharge: 2015-05-11 | Disposition: A | Discharge status: Home / Self Care | Payer: BLUE CROSS/BLUE SHIELD | Source: Ambulatory Visit | Attending: Radiation Oncology | Admitting: Radiation Oncology

## 2015-05-11 DIAGNOSIS — Z510 Encounter for antineoplastic radiation therapy: Secondary | ICD-10-CM | POA: Diagnosis not present

## 2015-05-12 ENCOUNTER — Ambulatory Visit
Admission: RE | Admit: 2015-05-12 | Discharge: 2015-05-12 | Disposition: A | Discharge status: Home / Self Care | Payer: BLUE CROSS/BLUE SHIELD | Source: Ambulatory Visit | Attending: Radiation Oncology | Admitting: Radiation Oncology

## 2015-05-12 DIAGNOSIS — Z510 Encounter for antineoplastic radiation therapy: Secondary | ICD-10-CM | POA: Diagnosis not present

## 2015-05-13 ENCOUNTER — Ambulatory Visit
Admission: RE | Admit: 2015-05-13 | Discharge: 2015-05-13 | Disposition: A | Discharge status: Home / Self Care | Payer: BLUE CROSS/BLUE SHIELD | Source: Ambulatory Visit | Attending: Radiation Oncology | Admitting: Radiation Oncology

## 2015-05-13 DIAGNOSIS — Z510 Encounter for antineoplastic radiation therapy: Secondary | ICD-10-CM | POA: Diagnosis not present

## 2015-05-16 ENCOUNTER — Ambulatory Visit
Admission: RE | Admit: 2015-05-16 | Discharge: 2015-05-16 | Disposition: A | Discharge status: Home / Self Care | Payer: BLUE CROSS/BLUE SHIELD | Source: Ambulatory Visit | Attending: Radiation Oncology | Admitting: Radiation Oncology

## 2015-05-16 ENCOUNTER — Telehealth: Payer: Self-pay | Admitting: *Deleted

## 2015-05-16 DIAGNOSIS — Z510 Encounter for antineoplastic radiation therapy: Secondary | ICD-10-CM | POA: Diagnosis not present

## 2015-05-16 NOTE — Telephone Encounter (Signed)
Spoke with patient to follow up after start of radiation.  She is doing well with no complaints.  Encouraged her to call with any needs or concerns.

## 2015-05-17 ENCOUNTER — Ambulatory Visit
Admission: RE | Admit: 2015-05-17 | Discharge: 2015-05-17 | Disposition: A | Discharge status: Home / Self Care | Payer: BLUE CROSS/BLUE SHIELD | Source: Ambulatory Visit | Attending: Radiation Oncology | Admitting: Radiation Oncology

## 2015-05-17 ENCOUNTER — Encounter: Payer: Self-pay | Admitting: Radiation Oncology

## 2015-05-17 VITALS — BP 119/75 | HR 67 | Temp 99.0°F | Resp 18 | Ht 62.0 in | Wt 146.4 lb

## 2015-05-17 DIAGNOSIS — C50412 Malignant neoplasm of upper-outer quadrant of left female breast: Secondary | ICD-10-CM

## 2015-05-17 DIAGNOSIS — Z510 Encounter for antineoplastic radiation therapy: Secondary | ICD-10-CM | POA: Diagnosis not present

## 2015-05-17 MED ORDER — RADIAPLEXRX EX GEL
Freq: Once | CUTANEOUS | Status: AC
  Administered 2015-05-17: 12:00:00 via TOPICAL

## 2015-05-17 NOTE — Progress Notes (Signed)
  Radiation Oncology         [phone] ________________________________  Name: Bethany Valdez MRN: [id number]  Date: 05/17/2015  DOB: 02-12-1966  Weekly Radiation Therapy Management    ICD-9-CM ICD-10-CM   1. Breast cancer of upper-outer quadrant of left female breast ([facility]) 174.4 C50.412 hyaluronate sodium (RADIAPLEXRX) gel     Current Dose: 12.6 Gy     Planned Dose:  62.4 Gy  Narrative . . . . . . . . The patient presents for routine under treatment assessment.                                   The patient is without complaint.  Bethany Valdez has completed 7 fractions to her left breast. She denies having pain or fatigue. She is using radiaplex and has been given a refill. She notices sensitivity to the left breast when jogging.                                 Set-up films were reviewed.                                 The chart was checked. Physical Findings. . .  height is 5\' 2"  (1.575 m) and weight is 146 lb 6.4 oz (66.407 kg). Her oral temperature is 99 F (37.2 C). Her blood pressure is 119/75 and her pulse is 67. Her respiration is 18. . Weight essentially stable.  No significant changes.  The lungs are clear. The heart has a regular rhythm and rate. The left breast area shows some mild erythema. No signs of infection. Impression . . . . . . . The patient is tolerating radiation. Plan . . . . . . . . . . . . Continue treatment as planned.  ________________________________   Blair Promise, PhD, MD

## 2015-05-17 NOTE — Progress Notes (Signed)
Bethany Valdez has completed 7 fractions to her left breast.  She denies having pain or fatigue.  She is using radiaplex and has been given a refill.  The skin on her left breast is pink and reports it is slightly swollen.  BP 119/75 mmHg  Pulse 67  Temp(Src) 99 F (37.2 C) (Oral)  Resp 18  Ht 5\' 2"  (1.575 m)  Wt 146 lb 6.4 oz (66.407 kg)  BMI 26.77 kg/m2

## 2015-05-18 ENCOUNTER — Ambulatory Visit
Admission: RE | Admit: 2015-05-18 | Discharge: 2015-05-18 | Disposition: A | Discharge status: Home / Self Care | Payer: BLUE CROSS/BLUE SHIELD | Source: Ambulatory Visit | Attending: Radiation Oncology | Admitting: Radiation Oncology

## 2015-05-18 DIAGNOSIS — Z510 Encounter for antineoplastic radiation therapy: Secondary | ICD-10-CM | POA: Diagnosis not present

## 2015-05-19 ENCOUNTER — Ambulatory Visit
Admission: RE | Admit: 2015-05-19 | Discharge: 2015-05-19 | Disposition: A | Discharge status: Home / Self Care | Payer: BLUE CROSS/BLUE SHIELD | Source: Ambulatory Visit | Attending: Radiation Oncology | Admitting: Radiation Oncology

## 2015-05-19 DIAGNOSIS — Z510 Encounter for antineoplastic radiation therapy: Secondary | ICD-10-CM | POA: Diagnosis not present

## 2015-05-20 ENCOUNTER — Ambulatory Visit
Admission: RE | Admit: 2015-05-20 | Discharge: 2015-05-20 | Disposition: A | Discharge status: Home / Self Care | Payer: BLUE CROSS/BLUE SHIELD | Source: Ambulatory Visit | Attending: Radiation Oncology | Admitting: Radiation Oncology

## 2015-05-20 DIAGNOSIS — Z510 Encounter for antineoplastic radiation therapy: Secondary | ICD-10-CM | POA: Diagnosis not present

## 2015-05-23 ENCOUNTER — Ambulatory Visit
Admission: RE | Admit: 2015-05-23 | Discharge: 2015-05-23 | Disposition: A | Discharge status: Home / Self Care | Payer: BLUE CROSS/BLUE SHIELD | Source: Ambulatory Visit | Attending: Radiation Oncology | Admitting: Radiation Oncology

## 2015-05-23 ENCOUNTER — Telehealth: Payer: Self-pay | Admitting: Oncology

## 2015-05-23 DIAGNOSIS — Z510 Encounter for antineoplastic radiation therapy: Secondary | ICD-10-CM | POA: Diagnosis not present

## 2015-05-23 NOTE — Telephone Encounter (Addendum)
Left a message for Amadi regarding her triage call over the weekend.  Requested a return call.

## 2015-05-23 NOTE — Telephone Encounter (Signed)
Bethany Valdez called back and said she went to the doctor over the weekend and was put on Antibiotics and cough medicine.  She is feeling better.

## 2015-05-24 ENCOUNTER — Encounter: Payer: Self-pay | Admitting: Radiation Oncology

## 2015-05-24 ENCOUNTER — Ambulatory Visit
Admission: RE | Admit: 2015-05-24 | Discharge: 2015-05-24 | Disposition: A | Discharge status: Home / Self Care | Payer: BLUE CROSS/BLUE SHIELD | Source: Ambulatory Visit | Attending: Radiation Oncology | Admitting: Radiation Oncology

## 2015-05-24 VITALS — BP 109/71 | HR 83 | Temp 98.3°F | Ht 62.0 in | Wt 144.5 lb

## 2015-05-24 DIAGNOSIS — C50412 Malignant neoplasm of upper-outer quadrant of left female breast: Secondary | ICD-10-CM

## 2015-05-24 DIAGNOSIS — Z510 Encounter for antineoplastic radiation therapy: Secondary | ICD-10-CM | POA: Diagnosis not present

## 2015-05-24 NOTE — Progress Notes (Signed)
  Radiation Oncology         (336) [phone]  ________________________________  Name: Bethany Valdez MRN: [id number]  Date: 05/24/2015  DOB: Aug 22, 1965  Weekly Radiation Therapy Management    ICD-9-CM ICD-10-CM   1. Breast cancer of upper-outer quadrant of left female breast ([facility]) 174.4 C50.412      Current Dose: 21.6 Gy     Planned Dose:  62.4 Gy  Narrative . . . . . . . . The patient presents for routine under treatment assessment.                                 Bethany Valdez presents for her 12th fraction of radiation to her Left Breast. She has recently had a cold and is on an antibiotic which she will finish tomorrow. She reports some fatigue from this. Her Left Breast is red, tender, and she reports some discomfort with clothes. She is using the radiaplex cream as directed.                                    Set-up films were reviewed.                                 The chart was checked. Physical Findings. . .  height is 5\' 2"  (1.575 m) and weight is 144 lb 8 oz (65.545 kg). Her temperature is 98.3 F (36.8 C). Her blood pressure is 109/71 and her pulse is 83. . The lungs are clear. The heart has a regular rhythm and rate. The left breast area shows erythema but no skin breakdown. Impression . . . . . . . The patient is tolerating radiation. Plan . . . . . . . . . . . . Continue treatment as planned.  ________________________________   Blair Promise, PhD, MD

## 2015-05-24 NOTE — Progress Notes (Signed)
Bethany Valdez presents for her 12th fraction of radiation to her Left Breast. She has recently had a cold and is on an antibiotic which she will finish tomorrow. She reports some fatigue from this. Her Left Breast is red, tender, and she reports some discomfort with clothes. She is using the radiaplex cream as directed.   BP 109/71 mmHg  Pulse 83  Temp(Src) 98.3 F (36.8 C)  Ht 5\' 2"  (1.575 m)  Wt 144 lb 8 oz (65.545 kg)  BMI 26.42 kg/m2

## 2015-05-25 ENCOUNTER — Ambulatory Visit
Admission: RE | Admit: 2015-05-25 | Discharge: 2015-05-25 | Disposition: A | Discharge status: Home / Self Care | Payer: BLUE CROSS/BLUE SHIELD | Source: Ambulatory Visit | Attending: Radiation Oncology | Admitting: Radiation Oncology

## 2015-05-25 DIAGNOSIS — Z510 Encounter for antineoplastic radiation therapy: Secondary | ICD-10-CM | POA: Diagnosis not present

## 2015-05-26 ENCOUNTER — Ambulatory Visit
Admission: RE | Admit: 2015-05-26 | Discharge: 2015-05-26 | Disposition: A | Discharge status: Home / Self Care | Payer: BLUE CROSS/BLUE SHIELD | Source: Ambulatory Visit | Attending: Radiation Oncology | Admitting: Radiation Oncology

## 2015-05-26 DIAGNOSIS — Z510 Encounter for antineoplastic radiation therapy: Secondary | ICD-10-CM | POA: Diagnosis not present

## 2015-05-27 ENCOUNTER — Ambulatory Visit
Admission: RE | Admit: 2015-05-27 | Discharge: 2015-05-27 | Disposition: A | Discharge status: Home / Self Care | Payer: BLUE CROSS/BLUE SHIELD | Source: Ambulatory Visit | Attending: Radiation Oncology | Admitting: Radiation Oncology

## 2015-05-27 ENCOUNTER — Encounter: Payer: Self-pay | Admitting: Hematology and Oncology

## 2015-05-27 DIAGNOSIS — Z510 Encounter for antineoplastic radiation therapy: Secondary | ICD-10-CM | POA: Diagnosis not present

## 2015-05-27 NOTE — Progress Notes (Signed)
I sent faxes sent  04/01/15  04/15/15 04/25/15 to medical records

## 2015-05-30 ENCOUNTER — Ambulatory Visit
Admission: RE | Admit: 2015-05-30 | Discharge: 2015-05-30 | Disposition: A | Discharge status: Home / Self Care | Payer: BLUE CROSS/BLUE SHIELD | Source: Ambulatory Visit | Attending: Radiation Oncology | Admitting: Radiation Oncology

## 2015-05-30 DIAGNOSIS — Z510 Encounter for antineoplastic radiation therapy: Secondary | ICD-10-CM | POA: Diagnosis not present

## 2015-05-31 ENCOUNTER — Ambulatory Visit
Admission: RE | Admit: 2015-05-31 | Discharge: 2015-05-31 | Disposition: A | Discharge status: Home / Self Care | Payer: BLUE CROSS/BLUE SHIELD | Source: Ambulatory Visit | Attending: Radiation Oncology | Admitting: Radiation Oncology

## 2015-05-31 VITALS — BP 121/76 | HR 72 | Temp 98.9°F | Resp 16 | Ht 62.0 in | Wt 145.6 lb

## 2015-05-31 DIAGNOSIS — Z510 Encounter for antineoplastic radiation therapy: Secondary | ICD-10-CM | POA: Diagnosis not present

## 2015-05-31 DIAGNOSIS — C50412 Malignant neoplasm of upper-outer quadrant of left female breast: Secondary | ICD-10-CM | POA: Insufficient documentation

## 2015-05-31 DIAGNOSIS — Z923 Personal history of irradiation: Secondary | ICD-10-CM | POA: Diagnosis not present

## 2015-05-31 MED ORDER — RADIAPLEXRX EX GEL
Freq: Once | CUTANEOUS | Status: AC
  Administered 2015-05-31: 12:00:00 via TOPICAL

## 2015-05-31 NOTE — Progress Notes (Signed)
Bethany Valdez has completed 17 fractions to her left breast.  She reports her left breast is tender.  She reports having slight fatigue.  She is using radiaplex and has been given a refill.  The skin on her left breast is red with dermatitis on the upper inner portion.   BP 121/76 mmHg  Pulse 72  Temp(Src) 98.9 F (37.2 C) (Oral)  Resp 16  Ht 5\' 2"  (1.575 m)  Wt 145 lb 9.6 oz (66.044 kg)  BMI 26.62 kg/m2

## 2015-05-31 NOTE — Progress Notes (Signed)
  Radiation Oncology         [phone] ________________________________  Name: Bethany Valdez MRN: [id number]  Date: 05/31/2015  DOB: [birth year]  Weekly Radiation Therapy Management    ICD-9-CM ICD-10-CM   1. Breast cancer of upper-outer quadrant of left female breast ([facility]) 174.4 C50.412 hyaluronate sodium (RADIAPLEXRX) gel     Current Dose: 30.6 Gy     Planned Dose:  62.4 Gy  Narrative . . . . . . . Thamas Jaegers has completed 17 fractions to her left breast. She reports her left breast is tender. She reports having slight fatigue. She is using radiaplex and has been given a refill..                                                                    Set-up films were reviewed.                                 The chart was checked. Physical Findings. . .  height is 5\' 2"  (1.575 m) and weight is 145 lb 9.6 oz (66.044 kg). Her oral temperature is 98.9 F (37.2 C). Her blood pressure is 121/76 and her pulse is 72. Her respiration is 16. . The lungs are clear. The heart has a regular rhythm and rate. The left breast area shows erythema but no dry desquamation or moist desquamation  Impression . . . . . . . The patient is tolerating radiation. Plan . . . . . . . . . . . . Continue treatment as planned.  ________________________________   Blair Promise, PhD, MD

## 2015-06-01 ENCOUNTER — Ambulatory Visit
Admission: RE | Admit: 2015-06-01 | Discharge: 2015-06-01 | Disposition: A | Discharge status: Home / Self Care | Payer: BLUE CROSS/BLUE SHIELD | Source: Ambulatory Visit | Attending: Radiation Oncology | Admitting: Radiation Oncology

## 2015-06-01 DIAGNOSIS — Z510 Encounter for antineoplastic radiation therapy: Secondary | ICD-10-CM | POA: Diagnosis not present

## 2015-06-02 ENCOUNTER — Ambulatory Visit
Admission: RE | Admit: 2015-06-02 | Discharge: 2015-06-02 | Disposition: A | Discharge status: Home / Self Care | Payer: BLUE CROSS/BLUE SHIELD | Source: Ambulatory Visit | Attending: Radiation Oncology | Admitting: Radiation Oncology

## 2015-06-02 DIAGNOSIS — Z510 Encounter for antineoplastic radiation therapy: Secondary | ICD-10-CM | POA: Diagnosis not present

## 2015-06-03 ENCOUNTER — Ambulatory Visit
Admission: RE | Admit: 2015-06-03 | Discharge: 2015-06-03 | Disposition: A | Discharge status: Home / Self Care | Payer: BLUE CROSS/BLUE SHIELD | Source: Ambulatory Visit | Attending: Radiation Oncology | Admitting: Radiation Oncology

## 2015-06-03 DIAGNOSIS — Z510 Encounter for antineoplastic radiation therapy: Secondary | ICD-10-CM | POA: Diagnosis not present

## 2015-06-06 ENCOUNTER — Ambulatory Visit
Admission: RE | Admit: 2015-06-06 | Discharge: 2015-06-06 | Disposition: A | Discharge status: Home / Self Care | Payer: BLUE CROSS/BLUE SHIELD | Source: Ambulatory Visit | Attending: Radiation Oncology | Admitting: Radiation Oncology

## 2015-06-06 DIAGNOSIS — Z510 Encounter for antineoplastic radiation therapy: Secondary | ICD-10-CM | POA: Diagnosis not present

## 2015-06-07 ENCOUNTER — Ambulatory Visit: Payer: BLUE CROSS/BLUE SHIELD | Admitting: Radiation Oncology

## 2015-06-07 ENCOUNTER — Ambulatory Visit
Admission: RE | Admit: 2015-06-07 | Discharge: 2015-06-07 | Disposition: A | Discharge status: Home / Self Care | Payer: BLUE CROSS/BLUE SHIELD | Source: Ambulatory Visit | Attending: Radiation Oncology | Admitting: Radiation Oncology

## 2015-06-07 ENCOUNTER — Encounter: Payer: Self-pay | Admitting: Radiation Oncology

## 2015-06-07 VITALS — BP 110/74 | HR 68 | Temp 98.3°F | Ht 62.0 in | Wt 147.1 lb

## 2015-06-07 DIAGNOSIS — Z923 Personal history of irradiation: Secondary | ICD-10-CM | POA: Diagnosis not present

## 2015-06-07 DIAGNOSIS — C50412 Malignant neoplasm of upper-outer quadrant of left female breast: Secondary | ICD-10-CM | POA: Insufficient documentation

## 2015-06-07 DIAGNOSIS — Z510 Encounter for antineoplastic radiation therapy: Secondary | ICD-10-CM | POA: Diagnosis not present

## 2015-06-07 MED ORDER — SONAFINE EX EMUL
1.0000 "application " | Freq: Once | CUTANEOUS | Status: AC
  Administered 2015-06-07: 1 via TOPICAL
  Filled 2015-06-07: qty 45

## 2015-06-07 NOTE — Progress Notes (Signed)
  Radiation Oncology         [phone] ________________________________  Name: Bethany Valdez MRN: [id number]  Date: 06/07/2015  DOB: [birth year]  Weekly Radiation Therapy Management    ICD-9-CM ICD-10-CM   1. Breast cancer of upper-outer quadrant of left female breast ([facility]) 174.4 C50.412 SONAFINE emulsion 1 application     Current Dose: 39.6 Gy     Planned Dose:  62.4 Gy  Narrative . . . . . . . . The patient presents for routine under treatment assessment.                                 Bethany Valdez has completed 22 fractions to her left breast. She reports having pain in her left breast that she is rating at a 5/10. She also reports having occasional sharp pains in her left breast. She reports having fatigue in the evenings. She also has itching of her left breast and said that hydrocortisone has not been helping. The skin on her left breast is red. She has been given sonafine to try for the itching. She is using Aleve for discomfort.                                   Set-up films were reviewed.                                 The chart was checked. Physical Findings. . .  height is 5\' 2"  (1.575 m) and weight is 147 lb 1.6 oz (66.724 kg). Her oral temperature is 98.3 F (36.8 C). Her blood pressure is 110/74 and her pulse is 68. . The lungs are clear. The heart has a regular rhythm and rate. The left breast area shows a brisk reaction without any skin breakdown. Impression . . . . . . . The patient is tolerating radiation. Plan . . . . . . . . . . . . Continue treatment as planned.  ________________________________   Blair Promise, PhD, MD

## 2015-06-07 NOTE — Addendum Note (Signed)
Encounter addended by: Jacqulyn Liner, RN on: 06/07/2015  5:03 PM<BR>     Documentation filed: Inpatient MAR

## 2015-06-07 NOTE — Progress Notes (Signed)
Bethany Valdez has completed 22 fractions to her left breast.  She reports having pain in her left breast that she is rating at a 5/10.  She also reports having occasional sharp pains in her left breast.  She reports having fatigue in the evenings.  She also has itching of her left breast and said that hydrocortisone has not been helping.  The skin on her left breast is red.  She has been given sonafine to try for the itching.  BP 110/74 mmHg  Pulse 68  Temp(Src) 98.3 F (36.8 C) (Oral)  Ht 5\' 2"  (1.575 m)  Wt 147 lb 1.6 oz (66.724 kg)  BMI 26.90 kg/m2   Wt Readings from Last 3 Encounters:  06/07/15 147 lb 1.6 oz (66.724 kg)  05/31/15 145 lb 9.6 oz (66.044 kg)  05/24/15 144 lb 8 oz (65.545 kg)

## 2015-06-08 ENCOUNTER — Ambulatory Visit
Admission: RE | Admit: 2015-06-08 | Discharge: 2015-06-08 | Disposition: A | Discharge status: Home / Self Care | Payer: BLUE CROSS/BLUE SHIELD | Source: Ambulatory Visit | Attending: Radiation Oncology | Admitting: Radiation Oncology

## 2015-06-08 DIAGNOSIS — Z510 Encounter for antineoplastic radiation therapy: Secondary | ICD-10-CM | POA: Diagnosis not present

## 2015-06-09 ENCOUNTER — Ambulatory Visit
Admission: RE | Admit: 2015-06-09 | Discharge: 2015-06-09 | Disposition: A | Discharge status: Home / Self Care | Payer: BLUE CROSS/BLUE SHIELD | Source: Ambulatory Visit | Attending: Radiation Oncology | Admitting: Radiation Oncology

## 2015-06-09 DIAGNOSIS — Z510 Encounter for antineoplastic radiation therapy: Secondary | ICD-10-CM | POA: Diagnosis not present

## 2015-06-10 ENCOUNTER — Ambulatory Visit
Admission: RE | Admit: 2015-06-10 | Discharge: 2015-06-10 | Disposition: A | Discharge status: Home / Self Care | Payer: BLUE CROSS/BLUE SHIELD | Source: Ambulatory Visit | Attending: Radiation Oncology | Admitting: Radiation Oncology

## 2015-06-10 DIAGNOSIS — Z510 Encounter for antineoplastic radiation therapy: Secondary | ICD-10-CM | POA: Diagnosis not present

## 2015-06-13 ENCOUNTER — Ambulatory Visit
Admission: RE | Admit: 2015-06-13 | Discharge: 2015-06-13 | Disposition: A | Discharge status: Home / Self Care | Payer: BLUE CROSS/BLUE SHIELD | Source: Ambulatory Visit | Attending: Radiation Oncology | Admitting: Radiation Oncology

## 2015-06-13 DIAGNOSIS — Z510 Encounter for antineoplastic radiation therapy: Secondary | ICD-10-CM | POA: Diagnosis not present

## 2015-06-14 ENCOUNTER — Encounter: Payer: Self-pay | Admitting: Radiation Oncology

## 2015-06-14 ENCOUNTER — Ambulatory Visit
Admission: RE | Admit: 2015-06-14 | Discharge: 2015-06-14 | Disposition: A | Discharge status: Home / Self Care | Payer: BLUE CROSS/BLUE SHIELD | Source: Ambulatory Visit | Attending: Radiation Oncology | Admitting: Radiation Oncology

## 2015-06-14 VITALS — BP 115/72 | HR 67 | Temp 97.7°F | Ht 62.0 in | Wt 145.3 lb

## 2015-06-14 DIAGNOSIS — C50412 Malignant neoplasm of upper-outer quadrant of left female breast: Secondary | ICD-10-CM

## 2015-06-14 DIAGNOSIS — Z510 Encounter for antineoplastic radiation therapy: Secondary | ICD-10-CM | POA: Diagnosis not present

## 2015-06-14 NOTE — Progress Notes (Signed)
  Radiation Oncology         [phone] ________________________________  Name: Bethany Valdez MRN: [id number]  Date: 06/14/2015  DOB: Aug 17, 1965  Weekly Radiation Therapy Management    ICD-9-CM ICD-10-CM   1. Breast cancer of upper-outer quadrant of left female breast ([facility]) 174.4 C50.412      Current Dose: 48.6 Gy     Planned Dose:  62.4 Gy  Narrative . . . . . . . . The patient presents for routine under treatment assessment.                                 Ms. Drumgoole is here for her 27th fraction of radiation to her Left Breast. She reports mild fatigue. She is working two jobs (one full time and one part time) and feels like this is causing some of her fatigue. The skin to her breast is red, hyperpigmented, and tender. The outer radius of her Left Breast has blisters to the upper inner quadrant. She has been using sonafine to the outer radius of her breast with improvements with previous blisters per her report. She is using radiaplex cream to the inner radius of her breast to help with redness and tenderness.                                 Set-up films were reviewed.                                 The chart was checked. Physical Findings. . .  height is 5\' 2"  (1.575 m) and weight is 145 lb 4.8 oz (65.908 kg). Her temperature is 97.7 F (36.5 C). Her blood pressure is 115/72 and her pulse is 67. . The lungs are clear. The heart has a regular rhythm and rate. Brisk erythema of the left breast with no moist desquamation. Impression . . . . . . . The patient is tolerating radiation. Plan . . . . . . . . . . . . Continue treatment as planned.  ________________________________   Blair Promise, PhD, MD  This document serves as a record of services personally performed by Gery Pray, MD. It was created on his behalf by Darcus Austin, a trained medical scribe. The creation of this record is based on the scribe's personal observations and the provider's statements to them. This document  has been checked and approved by the attending provider.

## 2015-06-14 NOTE — Progress Notes (Signed)
Bethany Valdez is here for her 27th fraction of radiation to her Left Breast. She reports mild fatigue. She is working two jobs (one full time and one part time) and feels like this is causing some of her fatigue. The skin to her breast is red, hyperpigmented, and tender. The outer radius of her Left Breast has blisters to the upper inner quadrant. She has been using sonafine to the outer radius of her breast with improvements with previous blisters per her report. She is using radiaplex cream to the inner radius of her breast to help with redness and tenderness.   BP 115/72 mmHg  Pulse 67  Temp(Src) 97.7 F (36.5 C)  Ht 5\' 2"  (1.575 m)  Wt 145 lb 4.8 oz (65.908 kg)  BMI 26.57 kg/m2

## 2015-06-15 ENCOUNTER — Ambulatory Visit
Admission: RE | Admit: 2015-06-15 | Discharge: 2015-06-15 | Disposition: A | Discharge status: Home / Self Care | Payer: BLUE CROSS/BLUE SHIELD | Source: Ambulatory Visit | Attending: Radiation Oncology | Admitting: Radiation Oncology

## 2015-06-15 DIAGNOSIS — Z510 Encounter for antineoplastic radiation therapy: Secondary | ICD-10-CM | POA: Diagnosis not present

## 2015-06-16 ENCOUNTER — Ambulatory Visit
Admission: RE | Admit: 2015-06-16 | Discharge: 2015-06-16 | Disposition: A | Discharge status: Home / Self Care | Payer: BLUE CROSS/BLUE SHIELD | Source: Ambulatory Visit | Attending: Radiation Oncology | Admitting: Radiation Oncology

## 2015-06-16 DIAGNOSIS — Z510 Encounter for antineoplastic radiation therapy: Secondary | ICD-10-CM | POA: Diagnosis not present

## 2015-06-17 ENCOUNTER — Ambulatory Visit
Admission: RE | Admit: 2015-06-17 | Discharge: 2015-06-17 | Disposition: A | Discharge status: Home / Self Care | Payer: BLUE CROSS/BLUE SHIELD | Source: Ambulatory Visit | Attending: Radiation Oncology | Admitting: Radiation Oncology

## 2015-06-17 DIAGNOSIS — Z510 Encounter for antineoplastic radiation therapy: Secondary | ICD-10-CM | POA: Diagnosis not present

## 2015-06-20 ENCOUNTER — Ambulatory Visit
Admission: RE | Admit: 2015-06-20 | Discharge: 2015-06-20 | Disposition: A | Discharge status: Home / Self Care | Payer: BLUE CROSS/BLUE SHIELD | Source: Ambulatory Visit | Attending: Radiation Oncology | Admitting: Radiation Oncology

## 2015-06-20 DIAGNOSIS — Z510 Encounter for antineoplastic radiation therapy: Secondary | ICD-10-CM | POA: Diagnosis not present

## 2015-06-21 ENCOUNTER — Ambulatory Visit
Admission: RE | Admit: 2015-06-21 | Discharge: 2015-06-21 | Disposition: A | Discharge status: Home / Self Care | Payer: BLUE CROSS/BLUE SHIELD | Source: Ambulatory Visit | Attending: Radiation Oncology | Admitting: Radiation Oncology

## 2015-06-21 ENCOUNTER — Encounter: Payer: Self-pay | Admitting: Radiation Oncology

## 2015-06-21 VITALS — BP 114/69 | HR 74 | Temp 98.2°F | Ht 62.0 in | Wt 147.6 lb

## 2015-06-21 DIAGNOSIS — C50412 Malignant neoplasm of upper-outer quadrant of left female breast: Secondary | ICD-10-CM | POA: Diagnosis present

## 2015-06-21 DIAGNOSIS — Z510 Encounter for antineoplastic radiation therapy: Secondary | ICD-10-CM | POA: Diagnosis not present

## 2015-06-21 MED ORDER — RADIAPLEXRX EX GEL
Freq: Once | CUTANEOUS | Status: AC
  Administered 2015-06-21: 12:00:00 via TOPICAL

## 2015-06-21 NOTE — Progress Notes (Signed)
  Radiation Oncology         [phone] ________________________________  Name: Bethany Valdez MRN: [id number]  Date: 06/21/2015  DOB: 04/09/1965  Weekly Radiation Therapy Management    ICD-9-CM ICD-10-CM   1. Breast cancer of upper-outer quadrant of left female breast ([facility]) 174.4 C50.412 hyaluronate sodium (RADIAPLEXRX) gel     Current Dose: 58.4 Gy     Planned Dose:  62.4 Gy  Narrative . . . . . . . . The patient presents for routine under treatment assessment.                                 Bethany Valdez presents for her 32nd fraction of radiation to her Left Breast. She reports fatigue, but is still working two jobs. She has peeling to her nipple area, underneath her breast, and around the scar to her axilla. She notes that these areas are also tender when touched. The Left Breast is red, hyperpigmented, and slightly swollen. She is using the radiaplex to these areas, and was supplied with another tube today. She was also given a follow up appointment card today.                                 Set-up films were reviewed.                                 The chart was checked. Physical Findings. . .  height is 5\' 2"  (1.575 m) and weight is 147 lb 9.6 oz (66.951 kg). Her temperature is 98.2 F (36.8 C). Her blood pressure is 114/69 and her pulse is 74. . The lungs are clear. The heart has a regular rhythm and rate. Erythema of the whole left breast with dry desquamation in the region of the lumpectomy. No moist desquamation. Impression . . . . . . . The patient is tolerating radiation. Plan . . . . . . . . . . . . Continue treatment as planned and she will return to the clinic in 1 month after the completion of her treatment.  ________________________________   Blair Promise, PhD, MD  This document serves as a record of services personally performed by Gery Pray, MD. It was created on his behalf by Darcus Austin, a trained medical scribe. The creation of this record is based on the  scribe's personal observations and the provider's statements to them. This document has been checked and approved by the attending provider.

## 2015-06-21 NOTE — Progress Notes (Signed)
Bethany Valdez presents for her 32nd fraction of radiation to her Left Breast. She reports fatigue, but is still working two jobs.  She has peeling to her nipple area, underneath her breast, and around the scar to her axilla. She notes that these areas are also tender when touched. The Left Breast is red, hyperpigmented and slightly swollen. She is using the radiaplex to these areas, and was supplied with another tube today. She was also given a follow up appointment today.  BP 114/69 mmHg  Pulse 74  Temp(Src) 98.2 F (36.8 C)  Ht 5\' 2"  (1.575 m)  Wt 147 lb 9.6 oz (66.951 kg)  BMI 26.99 kg/m2

## 2015-06-22 ENCOUNTER — Ambulatory Visit
Admission: RE | Admit: 2015-06-22 | Discharge: 2015-06-22 | Disposition: A | Discharge status: Home / Self Care | Payer: BLUE CROSS/BLUE SHIELD | Source: Ambulatory Visit | Attending: Radiation Oncology | Admitting: Radiation Oncology

## 2015-06-22 DIAGNOSIS — Z510 Encounter for antineoplastic radiation therapy: Secondary | ICD-10-CM | POA: Diagnosis not present

## 2015-06-23 ENCOUNTER — Encounter: Payer: Self-pay | Admitting: Radiation Oncology

## 2015-06-23 ENCOUNTER — Ambulatory Visit
Admission: RE | Admit: 2015-06-23 | Discharge: 2015-06-23 | Disposition: A | Discharge status: Home / Self Care | Payer: BLUE CROSS/BLUE SHIELD | Source: Ambulatory Visit | Attending: Radiation Oncology | Admitting: Radiation Oncology

## 2015-06-23 DIAGNOSIS — Z510 Encounter for antineoplastic radiation therapy: Secondary | ICD-10-CM | POA: Diagnosis not present

## 2015-06-24 ENCOUNTER — Telehealth: Payer: Self-pay | Admitting: *Deleted

## 2015-06-24 ENCOUNTER — Other Ambulatory Visit: Payer: Self-pay | Admitting: Adult Health

## 2015-06-24 DIAGNOSIS — C50412 Malignant neoplasm of upper-outer quadrant of left female breast: Secondary | ICD-10-CM

## 2015-06-24 NOTE — Telephone Encounter (Signed)
Spoke with patient to follow up after radiation completion.  She states she is doing well. Encouraged her to call with any needs or concerns.

## 2015-07-04 ENCOUNTER — Encounter: Payer: Self-pay | Admitting: Hematology and Oncology

## 2015-07-04 ENCOUNTER — Telehealth: Payer: Self-pay | Admitting: Hematology and Oncology

## 2015-07-04 ENCOUNTER — Ambulatory Visit (HOSPITAL_BASED_OUTPATIENT_CLINIC_OR_DEPARTMENT_OTHER): Payer: BLUE CROSS/BLUE SHIELD | Admitting: Hematology and Oncology

## 2015-07-04 DIAGNOSIS — C50412 Malignant neoplasm of upper-outer quadrant of left female breast: Secondary | ICD-10-CM

## 2015-07-04 DIAGNOSIS — Z170 Estrogen receptor positive status [ER+]: Secondary | ICD-10-CM

## 2015-07-04 DIAGNOSIS — Z79810 Long term (current) use of selective estrogen receptor modulators (SERMs): Secondary | ICD-10-CM | POA: Diagnosis not present

## 2015-07-04 MED ORDER — TAMOXIFEN CITRATE 20 MG PO TABS: 20.0000 mg | ORAL_TABLET | Freq: Every day | ORAL | Status: DC

## 2015-07-04 NOTE — Assessment & Plan Note (Signed)
Left Lumpectomy 03/24/15: IDC grade 1, 1.7 cm, 0/6 LN neg ER 90%, PR 100%, HER-2 negative ratio 1.35, Ki-67 5% T1C N0 (Stage 1A) Oncotype DX score 13, 8% risk of recurrence Adjuvant radiation therapy 05/09/2015 to 06/23/2015   Recommendations: Adjuvant antiestrogen therapy with tamoxifen 20 mg daily 5 years We discussed the risks and benefits of tamoxifen. These include but not limited to insomnia, hot flashes, mood changes, vaginal dryness, and weight gain. Although rare, serious side effects including endometrial cancer, risk of blood clots were also discussed. We strongly believe that the benefits far outweigh the risks. Patient understands these risks and consented to starting treatment. Planned treatment duration is 5 years.  Follow-up pending on Oncotype DX test result No orders of the defined types were placed in this encounter.

## 2015-07-04 NOTE — Progress Notes (Signed)
Patient Care Team: [name] Patient as PCP - General (General Practice) Sylvan Cheese, NP as Nurse Practitioner (Hematology and Oncology)  DIAGNOSIS: Breast cancer of upper-outer quadrant of left female breast [facility])   Staging form: Breast, AJCC 7th Edition     Clinical stage from 03/16/2015: Stage IA (T1c, N0, M0) - Unsigned       Staging comments: Staged at breast conference on 1.18.17    SUMMARY OF ONCOLOGIC HISTORY:   Breast cancer of upper-outer quadrant of left female breast ([place])   03/08/2015 Initial Diagnosis Left breast 1:00 biopsy: Invasive ductal carcinoma with DCIS, grade 1-2, ER 90%, PR 100%, HER-2 negative ratio 1.35, Ki-67 5%, 1.6 x 1.2 x 1 cm left breast distortion, T1 cN0 stage IA clinical stage   03/24/2015 Surgery Left Lumpectomy: IDC grade 1, 1.7 cm, 0/6 LN neg ER 90%, PR 100%, HER-2 negative ratio 1.35, Ki-67 5% T1C N0 (Stage 1A)Oncotype DX score 13, 8% ROR   05/09/2015 - 06/23/2015 Radiation Therapy Adjuvant XRT   07/04/2015 -  Anti-estrogen oral therapy Tamoxifen 20 mg daily 5 years    CHIEF COMPLIANT: follow-up after radiation therapy  INTERVAL HISTORY: Bethany Valdez is a 50 year old with above-mentioned history left breast cancer. Lumpectomy and radiation she was low risk Oncotype DX score and is here to receive here to start adjuvant tamoxifen therapy. She tolerated radiation fairly well. 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 Did not have much fatigue.  REVIEW OF SYSTEMS:   Constitutional: Denies fevers, chills or abnormal weight loss Eyes: Denies blurriness of vision Ears, nose, mouth, throat, and face: Denies mucositis or sore throat Respiratory: Denies cough, dyspnea or wheezes Cardiovascular: Denies palpitation, chest discomfort Gastrointestinal:  Denies nausea, heartburn or change in bowel habits Skin: Denies abnormal skin rashes Lymphatics: Denies new lymphadenopathy or easy bruising Neurological:Denies numbness, tingling or new  weaknesses Behavioral/Psych: Mood is stable, no new changes  Extremities: No lower extremity edema Breast:  denies any pain or lumps or nodules in either breasts All other systems were reviewed with the patient and are negative.  I have reviewed the past medical history, past surgical history, social history and family history with the patient and they are unchanged from previous note.  ALLERGIES:  has No Known Allergies.  MEDICATIONS:  Current Outpatient Prescriptions  Medication Sig Dispense Refill  . Calcium Carbonate-Vitamin D (CALCIUM-VITAMIN D) 500-200 MG-UNIT tablet Take 1 tablet by mouth daily.    . hyaluronate sodium (RADIAPLEXRX) GEL Apply 1 application topically 2 (two) times daily.    . non-metallic deodorant Jethro Poling) MISC Apply 1 application topically daily as needed.    . tamoxifen (NOLVADEX) 20 MG tablet Take 1 tablet (20 mg total) by mouth daily. 90 tablet 3  . Wound Dressings (SONAFINE EX) Apply topically.    Marland Kitchen zolpidem (AMBIEN) 5 MG tablet Take 1 tablet (5 mg total) by mouth at bedtime as needed for sleep. (Patient not taking: Reported on 04/27/2015) 10 tablet 0   No current facility-administered medications for this visit.    PHYSICAL EXAMINATION: ECOG PERFORMANCE STATUS: 1 - Symptomatic but completely ambulatory  Filed Vitals:   07/04/15 1120  BP: 120/73  Pulse: 72  Temp: 98.4 F (36.9 C)  Resp: 18   Filed Weights   07/04/15 1120  Weight: 152 lb 9.6 oz (69.219 kg)    GENERAL:alert, no distress and comfortable SKIN: skin color, texture, turgor are normal, no rashes or significant lesions EYES: normal, Conjunctiva are pink and non-injected, sclera clear OROPHARYNX:no exudate,  no erythema and lips, buccal mucosa, and tongue normal  NECK: supple, thyroid normal size, non-tender, without nodularity LYMPH:  no palpable lymphadenopathy in the cervical, axillary or inguinal LUNGS: clear to auscultation and percussion with normal breathing effort HEART: regular  rate & rhythm and no murmurs and no lower extremity edema ABDOMEN:abdomen soft, non-tender and normal bowel sounds MUSCULOSKELETAL:no cyanosis of digits and no clubbing  NEURO: alert & oriented x 3 with fluent speech, no focal motor/sensory deficits EXTREMITIES: No lower extremity edema  LABORATORY DATA:  I have reviewed the data as listed   Chemistry      Component Value Date/Time   NA 142 03/16/2015 1235   K 3.9 03/16/2015 1235   CO2 27 03/16/2015 1235   BUN 9.3 03/16/2015 1235   CREATININE 0.8 03/16/2015 1235      Component Value Date/Time   CALCIUM 9.3 03/16/2015 1235   ALKPHOS 59 03/16/2015 1235   AST 16 03/16/2015 1235   ALT 13 03/16/2015 1235   BILITOT 1.22* 03/16/2015 1235       Lab Results  Component Value Date   WBC 10.7* 03/16/2015   HGB 14.4 03/16/2015   HCT 42.7 03/16/2015   MCV 99.4 03/16/2015   PLT 263 03/16/2015   NEUTROABS 7.0* 03/16/2015     ASSESSMENT & PLAN:  Breast cancer of upper-outer quadrant of left female breast ([place]) Left Lumpectomy 03/24/15: IDC grade 1, 1.7 cm, 0/6 LN neg ER 90%, PR 100%, HER-2 negative ratio 1.35, Ki-67 5% T1C N0 (Stage 1A) Oncotype DX score 13, 8% risk of recurrence Adjuvant radiation therapy 05/09/2015 to 06/23/2015   Recommendations: Adjuvant antiestrogen therapy with tamoxifen 20 mg daily 5 years We discussed the risks and benefits of tamoxifen. These include but not limited to insomnia, hot flashes, mood changes, vaginal dryness, and weight gain. Although rare, serious side effects including endometrial cancer, risk of blood clots were also discussed. We strongly believe that the benefits far outweigh the risks. Patient understands these risks and consented to starting treatment. Planned treatment duration is 5 years.  Follow-up pending on Oncotype DX test result No orders of the defined types were placed in this encounter.    No orders of the defined types were placed in this encounter.   The patient has a  good understanding of the overall plan. she agrees with it. she will call with any problems that may develop before the next visit here.   Rulon Eisenmenger, MD 07/04/2015

## 2015-07-04 NOTE — Telephone Encounter (Signed)
appt made and avs printed °

## 2015-07-13 NOTE — Progress Notes (Signed)
  Radiation Oncology         [phone] ________________________________  Name: Amiley Shishido MRN: [id number]  Date: 06/23/2015  DOB: [birth year]  End of Treatment Note    ICD-9-CM ICD-10-CM   1. Breast cancer of upper-outer quadrant of left female breast ([place]) 174.4 C50.412     DIAGNOSIS: stage TIc, N0, Left invasive and in situ ductal carcinoma, ER 90%, PR 100%, Her2-neu negative with a HER2 neu 1.35 ratio, and KI67 5%.     Indication for treatment:  Post operative       Radiation treatment dates:   05/09/2015-06/23/2015  Site/dose:    1. The Left breast was treated to 50.4 Gy in 28 fractions at 1.8 Gy per fraction. 2. The Left breast was boosted to 12 Gy in 6 fractions at 2 Gy per fraction.   Beams/energy:    1. 3D-Breath hold // 6X, tangent beams 2. Electron [employer]  // 15 MeV  Narrative: The patient tolerated radiation treatment relatively well.   She experienced mild fatigue, as well as erythema of the whole left breast with dry desquamation in the region of the lumpectomy.   Plan: The patient has completed radiation treatment. The patient will return to radiation oncology clinic for routine followup in one month. I advised them to call or return sooner if they have any questions or concerns related to their recovery or treatment.  -----------------------------------  Blair Promise, PhD, MD  This document serves as a record of services personally performed by Gery Pray, MD. It was created on his behalf by Arlyce Harman, a trained medical scribe. The creation of this record is based on the scribe's personal observations and the provider's statements to them. This document has been checked and approved by the attending provider.

## 2015-07-26 ENCOUNTER — Encounter: Payer: Self-pay | Admitting: Oncology

## 2015-07-28 ENCOUNTER — Encounter: Payer: Self-pay | Admitting: Radiation Oncology

## 2015-07-28 ENCOUNTER — Ambulatory Visit
Admission: RE | Admit: 2015-07-28 | Discharge: 2015-07-28 | Disposition: A | Discharge status: Home / Self Care | Payer: BLUE CROSS/BLUE SHIELD | Source: Ambulatory Visit | Attending: Radiation Oncology | Admitting: Radiation Oncology

## 2015-07-28 VITALS — BP 130/76 | HR 64 | Temp 98.6°F | Ht 62.0 in | Wt 151.3 lb

## 2015-07-28 DIAGNOSIS — C50412 Malignant neoplasm of upper-outer quadrant of left female breast: Secondary | ICD-10-CM

## 2015-07-28 DIAGNOSIS — D0582 Other specified type of carcinoma in situ of left breast: Secondary | ICD-10-CM | POA: Insufficient documentation

## 2015-07-28 DIAGNOSIS — Z170 Estrogen receptor positive status [ER+]: Secondary | ICD-10-CM | POA: Insufficient documentation

## 2015-07-28 NOTE — Progress Notes (Signed)
Bethany Valdez here for follow up.  She denies having pain.  She reports her energy level is almost back to normal.  She is taking tamoxifen.  The skin on her left breast has hyperpigmentation.  She is using radiaplex.  BP 130/76 mmHg  Pulse 64  Temp(Src) 98.6 F (37 C) (Oral)  Ht 5\' 2"  (1.575 m)  Wt 151 lb 4.8 oz (68.629 kg)  BMI 27.67 kg/m2   Wt Readings from Last 3 Encounters:  07/28/15 151 lb 4.8 oz (68.629 kg)  07/04/15 152 lb 9.6 oz (69.219 kg)  06/21/15 147 lb 9.6 oz (66.951 kg)

## 2015-07-28 NOTE — Progress Notes (Signed)
  Radiation Oncology         [phone] ________________________________  Name: Bethany Valdez MRN: [id number]  Date: 07/28/2015  DOB: 1965/08/04  Electron beam Simulation  Note   Status: outpatient   NARRATIVE: On 06/15/2015 the patient underwent additional planning for radiation therapy directed at the left breast. The patient's treatment planning CT scan was reviewed and she had set up of a custom electron cutout field directed at the left breast. The patient will be treated with 15 megavoltage electrons [place]  isodose plan be generated for treatment.  Plan:the patient will receive 6 additional treatments at 2 gray per fraction for a boost dose of 12 gray -----------------------------------  Blair Promise, PhD, MD

## 2015-07-28 NOTE — Progress Notes (Signed)
  Radiation Oncology         [phone] ________________________________  Name: Bethany Valdez MRN: [id number]  Date: 07/28/2015  DOB: [birth year]    Follow-Up Visit Note  CC: [name] Patient  Nicholas Lose, MD   Diagnosis: stage TIc, N0, Left invasive and in situ ductal carcinoma, ER 90%, PR 100%, Her2-neu negative with a HER2 neu 1.35 ratio, and KI67 5%.  Interval Since Last Radiation: 1 months  05/09/2015-06/23/2015: 1. The Left breast was treated to 50.4 Gy in 28 fractions at 1.8 Gy per fraction. 2. The Left breast was boosted to 12 Gy in 6 fractions at 2 Gy per fraction.  Narrative:  The patient returns today for routine follow-up. She reports pain "here and there" in regards to her left breast. She reports her energy level is almost back to normal. She is taking tamoxifen. She would experience hot flashes that would wake her up at night. She is still using radiaplex to the left breast. She denies nipple discharge/bleeding.  ALLERGIES:  has No Known Allergies.  Meds: Current Outpatient Prescriptions  Medication Sig Dispense Refill  . Calcium Carbonate-Vitamin D (CALCIUM-VITAMIN D) 500-200 MG-UNIT tablet Take 1 tablet by mouth daily.    . hyaluronate sodium (RADIAPLEXRX) GEL Apply 1 application topically 2 (two) times daily. Reported on 07/28/2015    . tamoxifen (NOLVADEX) 20 MG tablet Take 1 tablet (20 mg total) by mouth daily. 90 tablet 3  . Wound Dressings (SONAFINE EX) Apply topically.    . non-metallic deodorant Jethro Poling) MISC Apply 1 application topically daily as needed. Reported on 07/28/2015    . zolpidem (AMBIEN) 5 MG tablet Take 1 tablet (5 mg total) by mouth at bedtime as needed for sleep. (Patient not taking: Reported on 04/27/2015) 10 tablet 0   No current facility-administered medications for this encounter.    Physical Findings: The patient is in no acute distress. Patient is alert and oriented.  height is '5\' 2"'$  (1.575 m) and weight is 151 lb 4.8 oz (68.629 kg). Her  oral temperature is 98.6 F (37 C). Her blood pressure is 130/76 and her pulse is 64.  Lungs are clear to auscultation bilaterally. Heart has regular rate and rhythm. No palpable cervical, supraclavicular, or axillary adenopathy. Abdomen soft, non-tender, normal bowel sounds. Skin has healed well on the left breast. Continues to have mild edema and hyperpigmentation changes. No dominant mass in the breast. Some edema in the nipple areolar complex.  Lab Findings: Lab Results  Component Value Date   WBC 10.7* 03/16/2015   HGB 14.4 03/16/2015   HCT 42.7 03/16/2015   MCV 99.4 03/16/2015   PLT 263 03/16/2015    Radiographic Findings: No results found.  Impression:  The patient is recovering from the effects of radiation.  Plan: The patient is scheduled to see Survivorship later this month and Dr. Lindi Adie in August. The patient will follow up with radiation oncology in 3 months.  ____________________________________ -----------------------------------  Blair Promise, PhD, MD  This document serves as a record of services personally performed by Gery Pray, MD. It was created on his behalf by Darcus Austin, a trained medical scribe. The creation of this record is based on the scribe's personal observations and the provider's statements to them. This document has been checked and approved by the attending provider.

## 2015-07-29 ENCOUNTER — Telehealth: Payer: Self-pay | Admitting: *Deleted

## 2015-07-29 NOTE — Telephone Encounter (Signed)
Message from patient requesting status of her FMLA paperwork. Inbasket sent to Raquel.

## 2015-07-29 NOTE — Addendum Note (Signed)
Encounter addended by: Jacqulyn Liner, RN on: 07/29/2015  1:10 PM<BR>     Documentation filed: Charges VN

## 2015-08-01 ENCOUNTER — Encounter: Payer: Self-pay | Admitting: Hematology and Oncology

## 2015-08-01 NOTE — Progress Notes (Signed)
recd forms via fax. I left mess for patient on time and would call

## 2015-08-02 ENCOUNTER — Encounter: Payer: Self-pay | Admitting: Hematology and Oncology

## 2015-08-02 NOTE — Progress Notes (Signed)
recd forms via fax. I left mess for patient on time and would call once done-left forms for dr. Lindi Adie

## 2015-08-03 ENCOUNTER — Encounter: Payer: Self-pay | Admitting: Hematology and Oncology

## 2015-08-03 NOTE — Progress Notes (Signed)
recd forms via fax. I left mess for patient on time and would call once done-left forms for dr. Renaee Munda  [phone] and copy mailed to her per her req-copy to medical recrds

## 2015-08-17 ENCOUNTER — Telehealth: Payer: Self-pay

## 2015-08-17 NOTE — Telephone Encounter (Signed)
Received VM from pt stating she needed to cancel her appointment for tomorrow.  Returned call to pt and let her know I would cancel that for her.  Asked pt if she wished to reschedule while on the phone.  Pt stated she would have to call us back to reschedule.  Pt without further questions or concerns at time of call.

## 2015-08-18 ENCOUNTER — Encounter: Payer: BLUE CROSS/BLUE SHIELD | Admitting: Nurse Practitioner

## 2015-09-02 ENCOUNTER — Encounter: Payer: Self-pay | Admitting: Nurse Practitioner

## 2015-09-02 DIAGNOSIS — C50412 Malignant neoplasm of upper-outer quadrant of left female breast: Secondary | ICD-10-CM

## 2015-09-02 NOTE — Progress Notes (Signed)
The Survivorship Care Plan was mailed to Bethany Valdez as she reported not being able to come in to the Survivorship Clinic for an in-person visit at this time. A letter was mailed to her outlining the purpose of the content of the care plan, as well as encouraging her to reach out to me with any questions or concerns.  My business card was included in the correspondence to the patient as well.    I will not be placing any follow-up appointments to the Survivorship Clinic for Bethany Valdez, but I am happy to see her at any time in the future for any survivorship concerns that may arise. Thank you for allowing me to participate in her care!  Kenn File, [place] [phone]

## 2015-10-04 ENCOUNTER — Ambulatory Visit (HOSPITAL_BASED_OUTPATIENT_CLINIC_OR_DEPARTMENT_OTHER): Payer: BLUE CROSS/BLUE SHIELD | Admitting: Hematology and Oncology

## 2015-10-04 ENCOUNTER — Encounter: Payer: Self-pay | Admitting: Hematology and Oncology

## 2015-10-04 DIAGNOSIS — R61 Generalized hyperhidrosis: Secondary | ICD-10-CM

## 2015-10-04 DIAGNOSIS — Z79810 Long term (current) use of selective estrogen receptor modulators (SERMs): Secondary | ICD-10-CM | POA: Diagnosis not present

## 2015-10-04 DIAGNOSIS — Z170 Estrogen receptor positive status [ER+]: Secondary | ICD-10-CM | POA: Diagnosis not present

## 2015-10-04 DIAGNOSIS — C50412 Malignant neoplasm of upper-outer quadrant of left female breast: Secondary | ICD-10-CM | POA: Diagnosis not present

## 2015-10-04 NOTE — Assessment & Plan Note (Signed)
Left Lumpectomy 03/24/15: IDC grade 1, 1.7 cm, 0/6 LN neg ER 90%, PR 100%, HER-2 negative ratio 1.35, Ki-67 5% T1C N0 (Stage 1A) Oncotype DX score 13, 8% risk of recurrence Adjuvant radiation therapy 05/09/2015 to 06/23/2015 Current treatment: Tamoxifen 20 mg daily started 07/04/2015  Tamoxifen toxicities:  Return to clinic in 6 months for follow-up of her breast exams.

## 2015-10-04 NOTE — Progress Notes (Signed)
Patient Care Team: [name] Patient as PCP - General (General Practice) Sylvan Cheese, NP as Nurse Practitioner (Hematology and Oncology) Gery Pray, MD as Consulting Physician (Radiation Oncology) Nicholas Lose, MD as Consulting Physician (Hematology and Oncology) Rolm Bookbinder, MD as Consulting Physician (General Surgery)  DIAGNOSIS: Breast cancer of upper-outer quadrant of left female breast [facility])   Staging form: Breast, AJCC 7th Edition   - Clinical stage from 03/16/2015: Stage IA (T1c, N0, M0) - Unsigned         Staging comments: Staged at breast conference on 1.18.17   - Pathologic stage from 03/24/2015: Stage IA (T1c, N0, cM0) - Unsigned  SUMMARY OF ONCOLOGIC HISTORY:   Breast cancer of upper-outer quadrant of left female breast ([place])   03/03/2015 Mammogram    An area of distortion is confirmed within the upper-outer quadrant of the left breast, at posterior depth     03/03/2015 Breast US    Spiculated hypoechoic mass within the left breast at the 1 o'clock axis, 6 cm from the nipple, measuring 1.6 x 1.2 x 1 cm, corresponding to the mammographic finding.     03/08/2015 Initial Diagnosis    Left breast 1:00 biopsy: Invasive ductal carcinoma with DCIS, grade 1-2, ER 90%, PR 100%, HER-2 negative ratio 1.35, Ki-67 5%, 1.6 x 1.2 x 1 cm left breast distortion     03/08/2015 Clinical Stage    Stage IA: T1c N0     03/24/2015 Surgery    Left Lumpectomy: IDC grade 1, 1.7 cm, 0/6 LN neg ER 90%, PR 100%, HER-2 negative ratio 1.35, Ki-67 5%     03/24/2015 Pathologic Stage    Stage IA: T1c N0     03/24/2015 Oncotype testing    RS 13 (8% ROR)     05/09/2015 - 06/23/2015 Radiation Therapy    Adjuvant XRT: The Left breast was treated to 50.4 Gy in 28 fractions at 1.8 Gy per fraction. The Left breast was boosted to 12 Gy in 6 fractions at 2 Gy per fraction.     07/04/2015 -  Anti-estrogen oral therapy    Tamoxifen 20 mg daily 5 years     09/02/2015 Survivorship    SCP mailed to  patient in lieu of in person visit      CHIEF COMPLIANT: Tamoxifen toxicity evaluation  INTERVAL HISTORY: Anaisa Radi is a 50 year old with above-mentioned history of left breast cancer treated with lumpectomy and radiation and is currently on tamoxifen. She is complaining that the tamoxifen is causing her night sweats which wake her up at night. Through the day she appears to be doing quite fine. She is now able to exercise and is back to running.  REVIEW OF SYSTEMS:   Constitutional: Denies fevers, chills or abnormal weight loss, complains of fatigue Eyes: Denies blurriness of vision, Ears, nose, mouth, throat, and face: Denies mucositis or sore throat Respiratory: Denies cough, dyspnea or wheezes Cardiovascular: Denies palpitation, chest discomfort Gastrointestinal:  Denies nausea, heartburn or change in bowel habits Skin: Denies abnormal skin rashes Lymphatics: Denies new lymphadenopathy or easy bruising Neurological:Denies numbness, tingling or new weaknesses Behavioral/Psych: Mood is stable, no new changes  Extremities: No lower extremity edema Breast: Slight swelling of the left breast All other systems were reviewed with the patient and are negative.  I have reviewed the past medical history, past surgical history, social history and family history with the patient and they are unchanged from previous note.  ALLERGIES:  has No Known Allergies.  MEDICATIONS:  Current Outpatient Prescriptions  Medication Sig Dispense Refill  . Calcium Carbonate-Vitamin D (CALCIUM-VITAMIN D) 500-200 MG-UNIT tablet Take 1 tablet by mouth daily.    . hyaluronate sodium (RADIAPLEXRX) GEL Apply 1 application topically 2 (two) times daily. Reported on 10/02/7670    . non-metallic deodorant Jethro Poling) MISC Apply 1 application topically daily as needed. Reported on 07/28/2015    . tamoxifen (NOLVADEX) 20 MG tablet Take 1 tablet (20 mg total) by mouth daily. 90 tablet 3  . Wound Dressings (SONAFINE EX) Apply  topically.    Marland Kitchen zolpidem (AMBIEN) 5 MG tablet Take 1 tablet (5 mg total) by mouth at bedtime as needed for sleep. (Patient not taking: Reported on 04/27/2015) 10 tablet 0   No current facility-administered medications for this visit.     PHYSICAL EXAMINATION: ECOG PERFORMANCE STATUS: 1 - Symptomatic but completely ambulatory  Vitals:   10/04/15 1103  BP: 117/75  Pulse: 78  Resp: 18  Temp: 99 F (37.2 C)   Filed Weights   10/04/15 1103  Weight: 151 lb 8 oz (68.7 kg)    GENERAL:alert, no distress and comfortable SKIN: skin color, texture, turgor are normal, no rashes or significant lesions EYES: normal, Conjunctiva are pink and non-injected, sclera clear OROPHARYNX:no exudate, no erythema and lips, buccal mucosa, and tongue normal  NECK: supple, thyroid normal size, non-tender, without nodularity LYMPH:  no palpable lymphadenopathy in the cervical, axillary or inguinal LUNGS: clear to auscultation and percussion with normal breathing effort HEART: regular rate & rhythm and no murmurs and no lower extremity edema ABDOMEN:abdomen soft, non-tender and normal bowel sounds MUSCULOSKELETAL:no cyanosis of digits and no clubbing  NEURO: alert & oriented x 3 with fluent speech, no focal motor/sensory deficits EXTREMITIES: No lower extremity edema  LABORATORY DATA:  I have reviewed the data as listed   Chemistry      Component Value Date/Time   NA 142 03/16/2015 1235   K 3.9 03/16/2015 1235   CO2 27 03/16/2015 1235   BUN 9.3 03/16/2015 1235   CREATININE 0.8 03/16/2015 1235      Component Value Date/Time   CALCIUM 9.3 03/16/2015 1235   ALKPHOS 59 03/16/2015 1235   AST 16 03/16/2015 1235   ALT 13 03/16/2015 1235   BILITOT 1.22 (H) 03/16/2015 1235       Lab Results  Component Value Date   WBC 10.7 (H) 03/16/2015   HGB 14.4 03/16/2015   HCT 42.7 03/16/2015   MCV 99.4 03/16/2015   PLT 263 03/16/2015   NEUTROABS 7.0 (H) 03/16/2015     ASSESSMENT & PLAN:  Breast cancer  of upper-outer quadrant of left female breast ([facility]) Left Lumpectomy 03/24/15: IDC grade 1, 1.7 cm, 0/6 LN neg ER 90%, PR 100%, HER-2 negative ratio 1.35, Ki-67 5% T1C N0 (Stage 1A) Oncotype DX score 13, 8% risk of recurrence Adjuvant radiation therapy 05/09/2015 to 06/23/2015 Current treatment: Tamoxifen 20 mg daily started 07/04/2015  Tamoxifen toxicities: 1. Night sweats 4 times a week that wake her up: I instructed her to take the medicine at bedtime instead of mornings. If she still cannot tolerate that we may have to reduce the dosage in half. 2. Mild aches and pains. Patient is back to running and this appears to be helping her symptoms. She is still slightly stressed because her son is going to college.  Left breast swelling: Probably related to prior radiation. Patient is seeing Dr. Donne Hazel next week. She will discuss this further with him.  Return to clinic in  6 months for follow-up of her breast exams.   No orders of the defined types were placed in this encounter.  The patient has a good understanding of the overall plan. she agrees with it. she will call with any problems that may develop before the next visit here.   Rulon Eisenmenger, MD 10/04/15

## 2015-11-03 ENCOUNTER — Encounter: Payer: Self-pay | Admitting: Radiation Oncology

## 2015-11-03 ENCOUNTER — Ambulatory Visit
Admission: RE | Admit: 2015-11-03 | Discharge: 2015-11-03 | Disposition: A | Discharge status: Home / Self Care | Payer: BLUE CROSS/BLUE SHIELD | Source: Ambulatory Visit | Attending: Radiation Oncology | Admitting: Radiation Oncology

## 2015-11-03 DIAGNOSIS — Z170 Estrogen receptor positive status [ER+]: Secondary | ICD-10-CM | POA: Insufficient documentation

## 2015-11-03 DIAGNOSIS — C50912 Malignant neoplasm of unspecified site of left female breast: Secondary | ICD-10-CM | POA: Insufficient documentation

## 2015-11-03 DIAGNOSIS — C50412 Malignant neoplasm of upper-outer quadrant of left female breast: Secondary | ICD-10-CM

## 2015-11-03 NOTE — Progress Notes (Signed)
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 The Left breast was treated to 50.4 Gy in 28 fractions at 1.8 Gy per fraction. 2. The Left breast was boosted to 12 Gy in 6 fractions at 2 Gy per fraction.  Narrative:  The patient returns today for routine follow-up. The patient started Tamoxifen on 07/04/15. She denies pain, but does report discomfort when she lays on her left side. She reports her energy level is improving.  Her temperature was elevated today at 99.6.  She reports having a cold with sinus drainage and she did not know that she was running a temperature that high. The skin on her left breast has hyperpigmentation as noted by the nurse. She reports that she has night sweats from the Tamoxifen that wake her up at night. Since starting to take it at night (under advice from Dr. Lindi Adie), she has been sleeping better. She denies nipple discharge/bleeding or swelling of her left arm.  ALLERGIES:  has No Known Allergies.  Meds: Current Outpatient Prescriptions  Medication Sig Dispense Refill  . Calcium Carbonate-Vitamin D (CALCIUM-VITAMIN D) 500-200 MG-UNIT tablet Take 1 tablet by mouth daily.    . tamoxifen (NOLVADEX) 20 MG tablet Take 1 tablet (20 mg total) by mouth daily. 90 tablet 3   No current facility-administered medications for this encounter.     Physical Findings: The patient is in no acute distress. Patient is alert and oriented.  height is _0  (1.575 m) and weight is 148 lb 12.8 oz (67.5 kg). Her oral temperature is 99.6 F (37.6 C). Her blood pressure is 117/82 and her pulse is 72.  Her oxygen saturation is 100%.  Lungs are clear to auscultation bilaterally. Heart has regular rate and rhythm. No palpable cervical, supraclavicular, or axillary adenopathy.  Some induration at the lumpectomy scar, no dominant mass appreciated in the left breast. No nipple discharge or bleeding.  Lab Findings: Lab Results  Component Value Date   WBC 10.7 (H) 03/16/2015   HGB 14.4 03/16/2015   HCT 42.7 03/16/2015   MCV 99.4 03/16/2015   PLT 263 03/16/2015    Radiographic Findings: No results found.  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 Some side effects associated with Tamoxifen.  Plan: I encouraged her to continue with yearly mammography and followup with medical oncology. I will see her back on an as-needed basis. I have encouraged her to call if she has any issues or concerns in the future. I wished her the very best. I also advised her that if she is to develop a fever she should contact her PCP. ____________________________________ -----------------------------------  Blair Promise, PhD, MD  This document serves as a record of services personally performed by Gery Pray, MD. It was created on his behalf by Darcus Austin, a trained medical scribe. The creation of this record is based on the scribe's personal observations and the provider's statements to them. This document  has been checked and approved by the attending provider.

## 2015-11-03 NOTE — Progress Notes (Signed)
Bethany Valdez here for follow up after treatment to her left breast.  She denies having pain but does have discomfort when she lays on her left side.  She is taking tamoxifen.  She reports her energy level is improving.  Her temperature was elevated today at 99.6.  She reports having a cold with sinus drainage.  The skin on her left breast has hyperpigmentation.  BP 117/82 (BP Location: Right Arm, Patient Position: Sitting)   Pulse 72   Temp 99.6 F (37.6 C) (Oral)   Ht 5\' 2"  (1.575 m)   Wt 148 lb 12.8 oz (67.5 kg)   SpO2 100%   BMI 27.22 kg/m    Wt Readings from Last 3 Encounters:  11/03/15 148 lb 12.8 oz (67.5 kg)  10/04/15 151 lb 8 oz (68.7 kg)  07/28/15 151 lb 4.8 oz (68.6 kg)

## 2016-03-27 ENCOUNTER — Other Ambulatory Visit: Payer: Self-pay | Admitting: Gynecology

## 2016-03-27 DIAGNOSIS — Z853 Personal history of malignant neoplasm of breast: Secondary | ICD-10-CM

## 2016-03-28 ENCOUNTER — Ambulatory Visit
Admission: RE | Admit: 2016-03-28 | Discharge: 2016-03-28 | Disposition: A | Discharge status: Home / Self Care | Payer: BLUE CROSS/BLUE SHIELD | Source: Ambulatory Visit | Attending: Gynecology | Admitting: Gynecology

## 2016-03-28 DIAGNOSIS — Z853 Personal history of malignant neoplasm of breast: Secondary | ICD-10-CM

## 2016-04-05 ENCOUNTER — Ambulatory Visit (HOSPITAL_BASED_OUTPATIENT_CLINIC_OR_DEPARTMENT_OTHER): Payer: BLUE CROSS/BLUE SHIELD | Admitting: Hematology and Oncology

## 2016-04-05 ENCOUNTER — Encounter: Payer: Self-pay | Admitting: Hematology and Oncology

## 2016-04-05 DIAGNOSIS — C50412 Malignant neoplasm of upper-outer quadrant of left female breast: Secondary | ICD-10-CM

## 2016-04-05 DIAGNOSIS — Z170 Estrogen receptor positive status [ER+]: Secondary | ICD-10-CM | POA: Diagnosis not present

## 2016-04-05 DIAGNOSIS — Z79810 Long term (current) use of selective estrogen receptor modulators (SERMs): Secondary | ICD-10-CM

## 2016-04-05 NOTE — Assessment & Plan Note (Signed)
Left Lumpectomy 03/24/15: IDC grade 1, 1.7 cm, 0/6 LN neg ER 90%, PR 100%, HER-2 negative ratio 1.35, Ki-67 5% T1C N0 (Stage 1A) Oncotype DX score 13, 8% risk of recurrence Adjuvant radiation therapy 05/09/2015 to 06/23/2015 Current treatment: Tamoxifen 20 mg daily started 07/04/2015  Tamoxifen toxicities: 1. Night sweats 4 times a week that wake her up: I instructed her to take the medicine at bedtime instead of mornings. If she still cannot tolerate that we may have to reduce the dosage in half. 2. Mild aches and pains. Patient is back to running and this appears to be helping her symptoms. She is still slightly stressed because her son is going to college.  Left breast swelling: Probably related to prior radiation. Patient is seeing Dr. Donne Hazel next week. She will discuss this further with him.  Return to clinic in 6 months for follow-up of her breast exams and after that we can see her once a year.

## 2016-04-05 NOTE — Progress Notes (Signed)
Patient Care Team: [name] Patient as PCP - General (General Practice) Sylvan Cheese, NP as Nurse Practitioner (Hematology and Oncology) Gery Pray, MD as Consulting Physician (Radiation Oncology) Nicholas Lose, MD as Consulting Physician (Hematology and Oncology) Rolm Bookbinder, MD as Consulting Physician (General Surgery)  DIAGNOSIS:  Encounter Diagnosis  Name Primary?  . Malignant neoplasm of upper-outer quadrant of left breast in female, estrogen receptor positive (Bethany Valdez)     SUMMARY OF ONCOLOGIC HISTORY:   Breast cancer of upper-outer quadrant of left female breast (Bethany Valdez)   03/03/2015 Mammogram    An area of distortion is confirmed within the upper-outer quadrant of the left breast, at posterior depth      03/03/2015 Breast US    Spiculated hypoechoic mass within the left breast at the 1 o'clock axis, 6 cm from the nipple, measuring 1.6 x 1.2 x 1 cm, corresponding to the mammographic finding.      03/08/2015 Initial Diagnosis    Left breast 1:00 biopsy: Invasive ductal carcinoma with DCIS, grade 1-2, ER 90%, PR 100%, HER-2 negative ratio 1.35, Ki-67 5%, 1.6 x 1.2 x 1 cm left breast distortion      03/08/2015 Clinical Stage    Stage IA: T1c N0      03/24/2015 Surgery    Left Lumpectomy: IDC grade 1, 1.7 cm, 0/6 LN neg ER 90%, PR 100%, HER-2 negative ratio 1.35, Ki-67 5%      03/24/2015 Pathologic Stage    Stage IA: T1c N0      03/24/2015 Oncotype testing    RS 13 (8% ROR)      05/09/2015 - 06/23/2015 Radiation Therapy    Adjuvant XRT: The Left breast was treated to 50.4 Gy in 28 fractions at 1.8 Gy per fraction. The Left breast was boosted to 12 Gy in 6 fractions at 2 Gy per fraction.      07/04/2015 -  Anti-estrogen oral therapy    Tamoxifen 20 mg daily 5 years      09/02/2015 Survivorship    SCP mailed to patient in lieu of in person visit       CHIEF COMPLIANT: Follow-up on tamoxifen therapy  INTERVAL HISTORY: Bethany Valdez is a 51 year old with  above-mentioned history of left breast cancer treated with lumpectomy and radiation and is currently on tamoxifen. She had lots of hot flashes and night sweats which have improved slowly over time. She denies any significant myalgias or arthralgias. She does have hot flashes middle of the night but not waking her up too often. Denies any emotional problems. Recent mammograms were normal. She does have numbness underneath the arm.  REVIEW OF SYSTEMS:   Constitutional: Denies fevers, chills or abnormal weight loss Eyes: Denies blurriness of vision Ears, nose, mouth, throat, and face: Denies mucositis or sore throat Respiratory: Denies cough, dyspnea or wheezes Cardiovascular: Denies palpitation, chest discomfort Gastrointestinal:  Denies nausea, heartburn or change in bowel habits Skin: Denies abnormal skin rashes Lymphatics: Denies new lymphadenopathy or easy bruising Neurological:Denies numbness, tingling or new weaknesses Behavioral/Psych: Mood is stable, no new changes  Extremities: No lower extremity edema Breast:  denies any pain or lumps or nodules in either breasts All other systems were reviewed with the patient and are negative.  I have reviewed the past medical history, past surgical history, social history and family history with the patient and they are unchanged from previous note.  ALLERGIES:  has No Known Allergies.  MEDICATIONS:  Current Outpatient Prescriptions  Medication Sig Dispense Refill  .  Calcium Carbonate-Vitamin D (CALCIUM-VITAMIN D) 500-200 MG-UNIT tablet Take 1 tablet by mouth daily.    . tamoxifen (NOLVADEX) 20 MG tablet Take 1 tablet (20 mg total) by mouth daily. 90 tablet 3   No current facility-administered medications for this visit.     PHYSICAL EXAMINATION: ECOG PERFORMANCE STATUS: 1 - Symptomatic but completely ambulatory  Vitals:   04/05/16 1026  BP: 131/76  Pulse: 74  Resp: 18  Temp: 98 F (36.7 C)   Filed Weights   04/05/16 1026    Weight: 150 lb 9.6 oz (68.3 kg)    GENERAL:alert, no distress and comfortable SKIN: skin color, texture, turgor are normal, no rashes or significant lesions EYES: normal, Conjunctiva are pink and non-injected, sclera clear OROPHARYNX:no exudate, no erythema and lips, buccal mucosa, and tongue normal  NECK: supple, thyroid normal size, non-tender, without nodularity LYMPH:  no palpable lymphadenopathy in the cervical, axillary or inguinal LUNGS: clear to auscultation and percussion with normal breathing effort HEART: regular rate & rhythm and no murmurs and no lower extremity edema ABDOMEN:abdomen soft, non-tender and normal bowel sounds MUSCULOSKELETAL:no cyanosis of digits and no clubbing  NEURO: alert & oriented x 3 with fluent speech, no focal motor/sensory deficits EXTREMITIES: No lower extremity edema BREAST: No palpable masses or nodules in either right or left breasts. No palpable axillary supraclavicular or infraclavicular adenopathy no breast tenderness or nipple discharge. (exam performed in the presence of a chaperone)  LABORATORY DATA:  I have reviewed the data as listed   Chemistry      Component Value Date/Time   NA 142 03/16/2015 1235   K 3.9 03/16/2015 1235   CO2 27 03/16/2015 1235   BUN 9.3 03/16/2015 1235   CREATININE 0.8 03/16/2015 1235      Component Value Date/Time   CALCIUM 9.3 03/16/2015 1235   ALKPHOS 59 03/16/2015 1235   AST 16 03/16/2015 1235   ALT 13 03/16/2015 1235   BILITOT 1.22 (H) 03/16/2015 1235       Lab Results  Component Value Date   WBC 10.7 (H) 03/16/2015   HGB 14.4 03/16/2015   HCT 42.7 03/16/2015   MCV 99.4 03/16/2015   PLT 263 03/16/2015   NEUTROABS 7.0 (H) 03/16/2015    ASSESSMENT & PLAN:  Breast cancer of upper-outer quadrant of left female breast (Bethany Valdez) Left Lumpectomy 03/24/15: IDC grade 1, 1.7 cm, 0/6 LN neg ER 90%, PR 100%, HER-2 negative ratio 1.35, Ki-67 5% T1C N0 (Stage 1A) Oncotype DX score 13, 8% risk of  recurrence Adjuvant radiation therapy 05/09/2015 to 06/23/2015 Current treatment: Tamoxifen 20 mg daily started 07/04/2015  Tamoxifen toxicities: 1. Night sweats : Much improved. 2. Mild aches and pains. Patient is back to running and this appears to be helping her symptoms.  Left breast swelling: Probably related to prior radiation. Patient is seeing Dr. Donne Hazel next week. She will discuss this further with him.  Return to clinic in 1 year.  I spent 25 minutes talking to the patient of which more than half was spent in counseling and coordination of care.  No orders of the defined types were placed in this encounter.  The patient has a good understanding of the overall plan. she agrees with it. she will call with any problems that may develop before the next visit here.   Rulon Eisenmenger, MD 04/05/16

## 2016-04-07 ENCOUNTER — Telehealth: Payer: Self-pay

## 2016-04-07 NOTE — Telephone Encounter (Signed)
Called and left a message with a new appt per 2/8 los

## 2016-06-26 ENCOUNTER — Other Ambulatory Visit: Payer: Self-pay | Admitting: Hematology and Oncology

## 2016-06-26 DIAGNOSIS — C50412 Malignant neoplasm of upper-outer quadrant of left female breast: Secondary | ICD-10-CM

## 2017-03-12 ENCOUNTER — Other Ambulatory Visit: Payer: Self-pay | Admitting: Gynecology

## 2017-03-12 DIAGNOSIS — Z853 Personal history of malignant neoplasm of breast: Secondary | ICD-10-CM

## 2017-04-02 ENCOUNTER — Ambulatory Visit
Admission: RE | Admit: 2017-04-02 | Discharge: 2017-04-02 | Disposition: A | Discharge status: Home / Self Care | Payer: BLUE CROSS/BLUE SHIELD | Source: Ambulatory Visit | Attending: Gynecology | Admitting: Gynecology

## 2017-04-02 DIAGNOSIS — R922 Inconclusive mammogram: Secondary | ICD-10-CM | POA: Diagnosis not present

## 2017-04-02 DIAGNOSIS — Z853 Personal history of malignant neoplasm of breast: Secondary | ICD-10-CM

## 2017-04-02 HISTORY — DX: Personal history of irradiation: Z92.3

## 2017-04-05 ENCOUNTER — Ambulatory Visit: Payer: BLUE CROSS/BLUE SHIELD | Admitting: Hematology and Oncology

## 2017-04-10 ENCOUNTER — Telehealth: Payer: Self-pay | Admitting: Hematology and Oncology

## 2017-04-10 ENCOUNTER — Inpatient Hospital Stay: Payer: BLUE CROSS/BLUE SHIELD | Attending: Hematology and Oncology | Admitting: Hematology and Oncology

## 2017-04-10 DIAGNOSIS — Z79810 Long term (current) use of selective estrogen receptor modulators (SERMs): Secondary | ICD-10-CM | POA: Diagnosis not present

## 2017-04-10 DIAGNOSIS — C50412 Malignant neoplasm of upper-outer quadrant of left female breast: Secondary | ICD-10-CM

## 2017-04-10 DIAGNOSIS — Z923 Personal history of irradiation: Secondary | ICD-10-CM | POA: Insufficient documentation

## 2017-04-10 DIAGNOSIS — Z170 Estrogen receptor positive status [ER+]: Secondary | ICD-10-CM | POA: Diagnosis not present

## 2017-04-10 DIAGNOSIS — D0512 Intraductal carcinoma in situ of left breast: Secondary | ICD-10-CM | POA: Insufficient documentation

## 2017-04-10 MED ORDER — TAMOXIFEN CITRATE 20 MG PO TABS: 20.0000 mg | ORAL_TABLET | Freq: Every day | ORAL | 3 refills | Status: DC

## 2017-04-10 NOTE — Telephone Encounter (Signed)
Gave avs and calendar for august  °

## 2017-04-10 NOTE — Progress Notes (Signed)
Patient Care Team: Patient, [name] as PCP - General (General Practice) Jake Shark, Johny Blamer, NP as Nurse Practitioner (Hematology and Oncology) Gery Pray, MD as Consulting Physician (Radiation Oncology) Nicholas Lose, MD as Consulting Physician (Hematology and Oncology) Rolm Bookbinder, MD as Consulting Physician (General Surgery)  DIAGNOSIS:  Encounter Diagnosis  Name Primary?  . Malignant neoplasm of upper-outer quadrant of left breast in female, estrogen receptor positive ([place])     SUMMARY OF ONCOLOGIC HISTORY:   Breast cancer of upper-outer quadrant of left female breast ([place])   03/03/2015 Mammogram    An area of distortion is confirmed within the upper-outer quadrant of the left breast, at posterior depth      03/03/2015 Breast US    Spiculated hypoechoic mass within the left breast at the 1 o'clock axis, 6 cm from the nipple, measuring 1.6 x 1.2 x 1 cm, corresponding to the mammographic finding.      03/08/2015 Initial Diagnosis    Left breast 1:00 biopsy: Invasive ductal carcinoma with DCIS, grade 1-2, ER 90%, PR 100%, HER-2 negative ratio 1.35, Ki-67 5%, 1.6 x 1.2 x 1 cm left breast distortion      03/08/2015 Clinical Stage    Stage IA: T1c N0      03/24/2015 Surgery    Left Lumpectomy: IDC grade 1, 1.7 cm, 0/6 LN neg ER 90%, PR 100%, HER-2 negative ratio 1.35, Ki-67 5%      03/24/2015 Pathologic Stage    Stage IA: T1c N0      03/24/2015 Oncotype testing    RS 13 (8% ROR)      05/09/2015 - 06/23/2015 Radiation Therapy    Adjuvant XRT: The Left breast was treated to 50.4 Gy in 28 fractions at 1.8 Gy per fraction. The Left breast was boosted to 12 Gy in 6 fractions at 2 Gy per fraction.      07/04/2015 -  Anti-estrogen oral therapy    Tamoxifen 20 mg daily 5 years      09/02/2015 Survivorship    SCP mailed to patient in lieu of in person visit       CHIEF COMPLIANT: Follow-up on tamoxifen therapy  INTERVAL HISTORY: Bethany Valdez is a 52 year old  with above-mentioned history left breast cancer treated with lumpectomy followed by radiation and is currently on tamoxifen.  She is tolerating it extremely well.  Does not have any hot flashes or myalgias.  Intermittent breast pain but otherwise no palpable lumps or nodules.  REVIEW OF SYSTEMS:   Constitutional: Denies fevers, chills or abnormal weight loss Eyes: Denies blurriness of vision Ears, nose, mouth, throat, and face: Denies mucositis or sore throat Respiratory: Denies cough, dyspnea or wheezes Cardiovascular: Denies palpitation, chest discomfort Gastrointestinal:  Denies nausea, heartburn or change in bowel habits Skin: Denies abnormal skin rashes Lymphatics: Denies new lymphadenopathy or easy bruising Neurological:Denies numbness, tingling or new weaknesses Behavioral/Psych: Mood is stable, no new changes  Extremities: No lower extremity edema Breast:  denies any pain or lumps or nodules in either breasts All other systems were reviewed with the patient and are negative.  I have reviewed the past medical history, past surgical history, social history and family history with the patient and they are unchanged from previous note.  ALLERGIES:  has No Known Allergies.  MEDICATIONS:  Current Outpatient Medications  Medication Sig Dispense Refill  . Calcium Carbonate-Vitamin D (CALCIUM-VITAMIN D) 500-200 MG-UNIT tablet Take 1 tablet by mouth daily.    . tamoxifen (NOLVADEX) 20 MG tablet TAKE  1 TABLET EVERY DAY 90 tablet 3   No current facility-administered medications for this visit.     PHYSICAL EXAMINATION: ECOG PERFORMANCE STATUS: 1 - Symptomatic but completely ambulatory  Vitals:   04/10/17 1121  BP: 127/85  Pulse: 66  Resp: 19  Temp: 98.2 F (36.8 C)  SpO2: 100%   Filed Weights   04/10/17 1121  Weight: 155 lb 6.4 oz (70.5 kg)    GENERAL:alert, no distress and comfortable SKIN: skin color, texture, turgor are normal, no rashes or significant lesions EYES:  normal, Conjunctiva are pink and non-injected, sclera clear OROPHARYNX:no exudate, no erythema and lips, buccal mucosa, and tongue normal  NECK: supple, thyroid normal size, non-tender, without nodularity LYMPH:  no palpable lymphadenopathy in the cervical, axillary or inguinal LUNGS: clear to auscultation and percussion with normal breathing effort HEART: regular rate & rhythm and no murmurs and no lower extremity edema ABDOMEN:abdomen soft, non-tender and normal bowel sounds MUSCULOSKELETAL:no cyanosis of digits and no clubbing  NEURO: alert & oriented x 3 with fluent speech, no focal motor/sensory deficits EXTREMITIES: No lower extremity edema BREAST: No palpable masses or nodules in either right or left breasts. No palpable axillary supraclavicular or infraclavicular adenopathy no breast tenderness or nipple discharge. (exam performed in the presence of a chaperone)  LABORATORY DATA:  I have reviewed the data as listed CMP Latest Ref Rng & Units 03/16/2015  Glucose 70 - 140 mg/dl 95  BUN 7.0 - 26.0 mg/dL 9.3  Creatinine 0.6 - 1.1 mg/dL 0.8  Sodium 136 - 145 mEq/L 142  Potassium 3.5 - 5.1 mEq/L 3.9  CO2 22 - 29 mEq/L 27  Calcium 8.4 - 10.4 mg/dL 9.3  Total Protein 6.4 - 8.3 g/dL 6.9  Total Bilirubin 0.20 - 1.20 mg/dL 1.22(H)  Alkaline Phos 40 - 150 U/L 59  AST 5 - 34 U/L 16  ALT 0 - 55 U/L 13    Lab Results  Component Value Date   WBC 10.7 (H) 03/16/2015   HGB 14.4 03/16/2015   HCT 42.7 03/16/2015   MCV 99.4 03/16/2015   PLT 263 03/16/2015   NEUTROABS 7.0 (H) 03/16/2015    ASSESSMENT & PLAN:  Breast cancer of upper-outer quadrant of left female breast ([place]) Left Lumpectomy 03/24/15: IDC grade 1, 1.7 cm, 0/6 LN neg ER 90%, PR 100%, HER-2 negative ratio 1.35, Ki-67 5% T1C N0 (Stage 1A) Oncotype DX score 13, 8% risk of recurrence Adjuvant radiation therapy 05/09/2015 to 06/23/2015 Current treatment: Tamoxifen 20 mg daily started 07/04/2015  Tamoxifen toxicities: 1. Night  sweats : Much improved. 2.Mild aches and pains. Patient is back to running and this appears to be helping her symptoms.  Patient stays very busy working 2 jobs.  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  Surveillance: Mammogram 04/02/2017: Benign  Return to clinic in  6 months and after that we can see her once a year..  I spent 25 minutes talking to the patient of which more than half was spent in counseling and coordination of care.  No orders of the defined types were placed in this encounter.  The patient has a good understanding of the overall plan. she agrees with it. she will call with any problems that may develop before the next visit here.   Harriette Ohara, MD 04/10/17

## 2017-04-10 NOTE — Assessment & Plan Note (Signed)
Left Lumpectomy 03/24/15: IDC grade 1, 1.7 cm, 0/6 LN neg ER 90%, PR 100%, HER-2 negative ratio 1.35, Ki-67 5% T1C N0 (Stage 1A) Oncotype DX score 13, 8% risk of recurrence Adjuvant radiation therapy 05/09/2015 to 06/23/2015 Current treatment: Tamoxifen 20 mg daily started 07/04/2015  Tamoxifen toxicities: 1. Night sweats : Much improved. 2.Mild aches and pains. Patient is back to running and this appears to be helping her symptoms.  Left breast swelling: Probably related to prior radiation.   Return to clinic in 1 year.

## 2017-10-08 ENCOUNTER — Telehealth: Payer: Self-pay | Admitting: Hematology and Oncology

## 2017-10-08 ENCOUNTER — Inpatient Hospital Stay: Payer: BLUE CROSS/BLUE SHIELD | Attending: Hematology and Oncology | Admitting: Hematology and Oncology

## 2017-10-08 DIAGNOSIS — Z170 Estrogen receptor positive status [ER+]: Secondary | ICD-10-CM | POA: Diagnosis not present

## 2017-10-08 DIAGNOSIS — Z923 Personal history of irradiation: Secondary | ICD-10-CM | POA: Insufficient documentation

## 2017-10-08 DIAGNOSIS — Z79810 Long term (current) use of selective estrogen receptor modulators (SERMs): Secondary | ICD-10-CM | POA: Insufficient documentation

## 2017-10-08 DIAGNOSIS — C50412 Malignant neoplasm of upper-outer quadrant of left female breast: Secondary | ICD-10-CM | POA: Diagnosis not present

## 2017-10-08 MED ORDER — TAMOXIFEN CITRATE 20 MG PO TABS: 20.0000 mg | ORAL_TABLET | Freq: Every day | ORAL | 3 refills | Status: DC

## 2017-10-08 NOTE — Progress Notes (Signed)
Patient Care Team: Patient, [name] as PCP - General (General Practice) Jake Shark, Johny Blamer, NP as Nurse Practitioner (Hematology and Oncology) Gery Pray, MD as Consulting Physician (Radiation Oncology) Nicholas Lose, MD as Consulting Physician (Hematology and Oncology) Rolm Bookbinder, MD as Consulting Physician (General Surgery)  DIAGNOSIS:  Encounter Diagnosis  Name Primary?  . Malignant neoplasm of upper-outer quadrant of left breast in female, estrogen receptor positive ([place])     SUMMARY OF ONCOLOGIC HISTORY:   Breast cancer of upper-outer quadrant of left female breast ([place])   03/03/2015 Mammogram    An area of distortion is confirmed within the upper-outer quadrant of the left breast, at posterior depth    03/03/2015 Breast US    Spiculated hypoechoic mass within the left breast at the 1 o'clock axis, 6 cm from the nipple, measuring 1.6 x 1.2 x 1 cm, corresponding to the mammographic finding.    03/08/2015 Initial Diagnosis    Left breast 1:00 biopsy: Invasive ductal carcinoma with DCIS, grade 1-2, ER 90%, PR 100%, HER-2 negative ratio 1.35, Ki-67 5%, 1.6 x 1.2 x 1 cm left breast distortion    03/08/2015 Clinical Stage    Stage IA: T1c N0    03/24/2015 Surgery    Left Lumpectomy: IDC grade 1, 1.7 cm, 0/6 LN neg ER 90%, PR 100%, HER-2 negative ratio 1.35, Ki-67 5%    03/24/2015 Pathologic Stage    Stage IA: T1c N0    03/24/2015 Oncotype testing    RS 13 (8% ROR)    05/09/2015 - 06/23/2015 Radiation Therapy    Adjuvant XRT: The Left breast was treated to 50.4 Gy in 28 fractions at 1.8 Gy per fraction. The Left breast was boosted to 12 Gy in 6 fractions at 2 Gy per fraction.    07/04/2015 -  Anti-estrogen oral therapy    Tamoxifen 20 mg daily 5 years    09/02/2015 Survivorship    SCP mailed to patient in lieu of in person visit     CHIEF COMPLIANT: Follow-up on tamoxifen therapy  INTERVAL HISTORY: Bethany Valdez is a 18-year with above-mentioned history of  stage I left breast cancer treated with lumpectomy radiation is currently on tamoxifen appears to be tolerating tamoxifen moderately well.  She does have hot flashes but she is able to manage them.  She feels more fatigued than before.  She is still able to run but has not been able to fully recover her baseline strength.  It appears that there may be some emotional issues with her son having difficulties in college.  Her son also had a bad accident.  She denies any lumps or nodules in the breast.  REVIEW OF SYSTEMS:   Constitutional: Denies fevers, chills or abnormal weight loss Eyes: Denies blurriness of vision Ears, nose, mouth, throat, and face: Denies mucositis or sore throat Respiratory: Denies cough, dyspnea or wheezes Cardiovascular: Denies palpitation, chest discomfort Gastrointestinal:  Denies nausea, heartburn or change in bowel habits Skin: Denies abnormal skin rashes Lymphatics: Denies new lymphadenopathy or easy bruising Neurological:Denies numbness, tingling or new weaknesses Behavioral/Psych: Mood is stable, no new changes  Extremities: No lower extremity edema Breast:  denies any pain or lumps or nodules in either breasts All other systems were reviewed with the patient and are negative.  I have reviewed the past medical history, past surgical history, social history and family history with the patient and they are unchanged from previous note.  ALLERGIES:  has No Known Allergies.  MEDICATIONS:  Current  Outpatient Medications  Medication Sig Dispense Refill  . Calcium Carbonate-Vitamin D (CALCIUM-VITAMIN D) 500-200 MG-UNIT tablet Take 1 tablet by mouth daily.    . tamoxifen (NOLVADEX) 20 MG tablet Take 1 tablet (20 mg total) by mouth daily. 90 tablet 3   No current facility-administered medications for this visit.     PHYSICAL EXAMINATION: ECOG PERFORMANCE STATUS: 1 - Symptomatic but completely ambulatory  Vitals:   10/08/17 1434  BP: 118/71  Pulse: 77  Resp: 18    Temp: 99.5 F (37.5 C)  SpO2: 99%   Filed Weights   10/08/17 1434  Weight: 155 lb 8 oz (70.5 kg)    GENERAL:alert, no distress and comfortable SKIN: skin color, texture, turgor are normal, no rashes or significant lesions EYES: normal, Conjunctiva are pink and non-injected, sclera clear OROPHARYNX:no exudate, no erythema and lips, buccal mucosa, and tongue normal  NECK: supple, thyroid normal size, non-tender, without nodularity LYMPH:  no palpable lymphadenopathy in the cervical, axillary or inguinal LUNGS: clear to auscultation and percussion with normal breathing effort HEART: regular rate & rhythm and no murmurs and no lower extremity edema ABDOMEN:abdomen soft, non-tender and normal bowel sounds MUSCULOSKELETAL:no cyanosis of digits and no clubbing  NEURO: alert & oriented x 3 with fluent speech, no focal motor/sensory deficits EXTREMITIES: No lower extremity edema BREAST: The breast no palpable masses or nodules in either right or left breasts. No palpable axillary supraclavicular or infraclavicular adenopathy no breast tenderness or nipple discharge. (exam performed in the presence of a chaperone)  LABORATORY DATA:  I have reviewed the data as listed CMP Latest Ref Rng & Units 03/16/2015  Glucose 70 - 140 mg/dl 95  BUN 7.0 - 26.0 mg/dL 9.3  Creatinine 0.6 - 1.1 mg/dL 0.8  Sodium 136 - 145 mEq/L 142  Potassium 3.5 - 5.1 mEq/L 3.9  CO2 22 - 29 mEq/L 27  Calcium 8.4 - 10.4 mg/dL 9.3  Total Protein 6.4 - 8.3 g/dL 6.9  Total Bilirubin 0.20 - 1.20 mg/dL 1.22(H)  Alkaline Phos 40 - 150 U/L 59  AST 5 - 34 U/L 16  ALT 0 - 55 U/L 13    Lab Results  Component Value Date   WBC 10.7 (H) 03/16/2015   HGB 14.4 03/16/2015   HCT 42.7 03/16/2015   MCV 99.4 03/16/2015   PLT 263 03/16/2015   NEUTROABS 7.0 (H) 03/16/2015    ASSESSMENT & PLAN:  Breast cancer of upper-outer quadrant of left female breast ([place]) Left Lumpectomy 03/24/15: IDC grade 1, 1.7 cm, 0/6 LN neg ER 90%, PR  100%, HER-2 negative ratio 1.35, Ki-67 5% T1C N0 (Stage 1A) Oncotype DX score 13, 8% risk of recurrence Adjuvant radiation therapy 05/09/2015 to 06/23/2015 Current treatment: Tamoxifen 20 mg daily started 07/04/2015  Tamoxifen toxicities: 1. Night sweats: Much improved. 2.Mild aches and pains. Patient is back to running and this appears to be helping her symptoms.  Patient stays very busy working 2 jobs.  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  Surveillance: Mammogram 04/02/2017: Benign  Return to clinic in1 year for follow-up    No orders of the defined types were placed in this encounter.  The patient has a good understanding of the overall plan. she agrees with it. she will call with any problems that may develop before the next visit here.   Harriette Ohara, MD 10/08/17

## 2017-10-08 NOTE — Assessment & Plan Note (Signed)
Left Lumpectomy 03/24/15: IDC grade 1, 1.7 cm, 0/6 LN neg ER 90%, PR 100%, HER-2 negative ratio 1.35, Ki-67 5% T1C N0 (Stage 1A) Oncotype DX score 13, 8% risk of recurrence Adjuvant radiation therapy 05/09/2015 to 06/23/2015 Current treatment: Tamoxifen 20 mg daily started 07/04/2015  Tamoxifen toxicities: 1. Night sweats: Much improved. 2.Mild aches and pains. Patient is back to running and this appears to be helping her symptoms.  Patient stays very busy working 2 jobs.  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  Surveillance: Mammogram 04/02/2017: Benign  Return to clinic in1 year for follow-up

## 2017-10-08 NOTE — Telephone Encounter (Signed)
Gave patient avs report and appointments for August 2020

## 2018-04-10 ENCOUNTER — Other Ambulatory Visit: Payer: Self-pay | Admitting: Gynecology

## 2018-04-10 DIAGNOSIS — Z853 Personal history of malignant neoplasm of breast: Secondary | ICD-10-CM

## 2018-04-18 ENCOUNTER — Other Ambulatory Visit: Payer: Self-pay | Admitting: Family Medicine

## 2018-04-22 ENCOUNTER — Ambulatory Visit
Admission: RE | Admit: 2018-04-22 | Discharge: 2018-04-22 | Disposition: A | Discharge status: Home / Self Care | Payer: BLUE CROSS/BLUE SHIELD | Source: Ambulatory Visit | Attending: Gynecology | Admitting: Gynecology

## 2018-04-22 DIAGNOSIS — Z853 Personal history of malignant neoplasm of breast: Secondary | ICD-10-CM

## 2018-04-22 DIAGNOSIS — R922 Inconclusive mammogram: Secondary | ICD-10-CM | POA: Diagnosis not present

## 2018-05-13 DIAGNOSIS — Z130 Encounter for screening for diseases of the blood and blood-forming organs and certain disorders involving the immune mechanism: Secondary | ICD-10-CM | POA: Diagnosis not present

## 2018-05-13 DIAGNOSIS — Z01419 Encounter for gynecological examination (general) (routine) without abnormal findings: Secondary | ICD-10-CM | POA: Diagnosis not present

## 2018-05-13 DIAGNOSIS — Z124 Encounter for screening for malignant neoplasm of cervix: Secondary | ICD-10-CM | POA: Diagnosis not present

## 2018-05-13 DIAGNOSIS — Z30431 Encounter for routine checking of intrauterine contraceptive device: Secondary | ICD-10-CM | POA: Diagnosis not present

## 2018-07-15 DIAGNOSIS — R87810 Cervical high risk human papillomavirus (HPV) DNA test positive: Secondary | ICD-10-CM | POA: Diagnosis not present

## 2018-07-15 DIAGNOSIS — N72 Inflammatory disease of cervix uteri: Secondary | ICD-10-CM | POA: Diagnosis not present

## 2018-07-15 DIAGNOSIS — Z30431 Encounter for routine checking of intrauterine contraceptive device: Secondary | ICD-10-CM | POA: Diagnosis not present

## 2018-10-01 ENCOUNTER — Telehealth: Payer: Self-pay | Admitting: Hematology and Oncology

## 2018-10-01 NOTE — Telephone Encounter (Signed)
I talk with patient regarding visit  °

## 2018-10-01 NOTE — Assessment & Plan Note (Signed)
Left Lumpectomy 03/24/15: IDC grade 1, 1.7 cm, 0/6 LN neg ER 90%, PR 100%, HER-2 negative ratio 1.35, Ki-67 5% T1C N0 (Stage 1A) Oncotype DX score 13, 8% risk of recurrence Adjuvant radiation therapy 05/09/2015 to 06/23/2015 Current treatment: Tamoxifen 20 mg daily started 07/04/2015  Tamoxifen toxicities: 1. Night sweats: Much improved. 2.Mild aches and pains. Patient is back to running and this appears to be helping her symptoms.  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  Surveillance: Mammogram 04/22/2018: Benign  Return to clinic in1 year for follow-up

## 2018-10-06 NOTE — Progress Notes (Signed)
HEMATOLOGY-ONCOLOGY DOXIMITY VISIT PROGRESS NOTE  I connected with Bethany Valdez on 10/07/2018 at  3:00 PM EDT by Doximity video conference and verified that I am speaking with the correct person using two identifiers.  I discussed the limitations, risks, security and privacy concerns of performing an evaluation and management service by Doximity and the availability of in person appointments.  I also discussed with the patient that there may be a patient responsible charge related to this service. The patient expressed understanding and agreed to proceed.  Patient's Location: Home Physician Location: Clinic  CHIEF COMPLIANT: Follow-up of left breast cancer on tamoxifen therapy  INTERVAL HISTORY: Bethany Valdez is a 53 y.o. female with above-mentioned history of left breast cancer treated with lumpectomy, radiation, and who is currently on anti-estrogen therapy with tamoxifen. I last saw her a year ago. Mammogram on 04/22/18 showed no evidence of malignancy bilaterally. She presents over Doximity today for annual follow-up.   Oncology History  Breast cancer of upper-outer quadrant of left female breast ([place])  03/03/2015 Mammogram   An area of distortion is confirmed within the upper-outer quadrant of the left breast, at posterior depth   03/03/2015 Breast US   Spiculated hypoechoic mass within the left breast at the 1 o'clock axis, 6 cm from the nipple, measuring 1.6 x 1.2 x 1 cm, corresponding to the mammographic finding.   03/08/2015 Initial Diagnosis   Left breast 1:00 biopsy: Invasive ductal carcinoma with DCIS, grade 1-2, ER 90%, PR 100%, HER-2 negative ratio 1.35, Ki-67 5%, 1.6 x 1.2 x 1 cm left breast distortion   03/08/2015 Clinical Stage   Stage IA: T1c N0   03/24/2015 Surgery   Left Lumpectomy: IDC grade 1, 1.7 cm, 0/6 LN neg ER 90%, PR 100%, HER-2 negative ratio 1.35, Ki-67 5%   03/24/2015 Pathologic Stage   Stage IA: T1c N0   03/24/2015 Oncotype testing   RS 13 (8% ROR)   05/09/2015  - 06/23/2015 Radiation Therapy   Adjuvant XRT: The Left breast was treated to 50.4 Gy in 28 fractions at 1.8 Gy per fraction. The Left breast was boosted to 12 Gy in 6 fractions at 2 Gy per fraction.   07/04/2015 -  Anti-estrogen oral therapy   Tamoxifen 20 mg daily 5 years   09/02/2015 Survivorship   SCP mailed to patient in lieu of in person visit     REVIEW OF SYSTEMS:   Constitutional: Denies fevers, chills or abnormal weight loss Eyes: Denies blurriness of vision Ears, nose, mouth, throat, and face: Denies mucositis or sore throat Respiratory: Denies cough, dyspnea or wheezes Cardiovascular: Denies palpitation, chest discomfort Gastrointestinal:  Denies nausea, heartburn or change in bowel habits Skin: Denies abnormal skin rashes Lymphatics: Denies new lymphadenopathy or easy bruising Neurological:Denies numbness, tingling or new weaknesses Behavioral/Psych: Mood is stable, no new changes  Extremities: No lower extremity edema Breast: denies any pain or lumps or nodules in either breasts All other systems were reviewed with the patient and are negative.  Observations/Objective:  There were no vitals filed for this visit. There is no height or weight on file to calculate BMI.  I have reviewed the data as listed CMP Latest Ref Rng & Units 03/16/2015  Glucose 70 - 140 mg/dl 95  BUN 7.0 - 26.0 mg/dL 9.3  Creatinine 0.6 - 1.1 mg/dL 0.8  Sodium 136 - 145 mEq/L 142  Potassium 3.5 - 5.1 mEq/L 3.9  CO2 22 - 29 mEq/L 27  Calcium 8.4 - 10.4 mg/dL 9.3  Total  Protein 6.4 - 8.3 g/dL 6.9  Total Bilirubin 0.20 - 1.20 mg/dL 1.22(H)  Alkaline Phos 40 - 150 U/L 59  AST 5 - 34 U/L 16  ALT 0 - 55 U/L 13    Lab Results  Component Value Date   WBC 10.7 (H) 03/16/2015   HGB 14.4 03/16/2015   HCT 42.7 03/16/2015   MCV 99.4 03/16/2015   PLT 263 03/16/2015   NEUTROABS 7.0 (H) 03/16/2015      Assessment Plan:  Breast cancer of upper-outer quadrant of left female breast ([place]) Left  Lumpectomy 03/24/15: IDC grade 1, 1.7 cm, 0/6 LN neg ER 90%, PR 100%, HER-2 negative ratio 1.35, Ki-67 5% T1C N0 (Stage 1A) Oncotype DX score 13, 8% risk of recurrence Adjuvant radiation therapy 05/09/2015 to 06/23/2015 Current treatment: Tamoxifen 20 mg daily started 07/04/2015  Tamoxifen toxicities: 1. Night sweats: Much improved. 2.Mild aches and pains. Patient is back to running and this appears to be helping her symptoms.  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  Surveillance: Mammogram 04/22/2018: Benign  Return to clinic in1 year for follow-up   I discussed the assessment and treatment plan with the patient. The patient was provided an opportunity to ask questions and all were answered. The patient agreed with the plan and demonstrated an understanding of the instructions. The patient was advised to call back or seek an in-person evaluation if the symptoms worsen or if the condition fails to improve as anticipated.   I provided 15 minutes of face-to-face Doximity time during this encounter.    Rulon Eisenmenger, MD 10/07/2018   I, Molly Dorshimer, am acting as scribe for Nicholas Lose, MD.  I have reviewed the above documentation for accuracy and completeness, and I agree with the above.

## 2018-10-07 ENCOUNTER — Inpatient Hospital Stay: Payer: BC Managed Care – PPO | Attending: Hematology and Oncology | Admitting: Hematology and Oncology

## 2018-10-07 DIAGNOSIS — Z923 Personal history of irradiation: Secondary | ICD-10-CM | POA: Diagnosis not present

## 2018-10-07 DIAGNOSIS — C50412 Malignant neoplasm of upper-outer quadrant of left female breast: Secondary | ICD-10-CM | POA: Diagnosis not present

## 2018-10-07 DIAGNOSIS — Z79810 Long term (current) use of selective estrogen receptor modulators (SERMs): Secondary | ICD-10-CM

## 2018-10-07 DIAGNOSIS — Z170 Estrogen receptor positive status [ER+]: Secondary | ICD-10-CM

## 2018-10-07 MED ORDER — TAMOXIFEN CITRATE 20 MG PO TABS: 20.0000 mg | ORAL_TABLET | Freq: Every day | ORAL | 3 refills | Status: DC

## 2018-10-08 ENCOUNTER — Telehealth: Payer: Self-pay | Admitting: Hematology and Oncology

## 2018-10-08 NOTE — Telephone Encounter (Signed)
I talk with patient regarding schedule  

## 2019-01-15 DIAGNOSIS — Z8261 Family history of arthritis: Secondary | ICD-10-CM | POA: Diagnosis not present

## 2019-01-15 DIAGNOSIS — C50919 Malignant neoplasm of unspecified site of unspecified female breast: Secondary | ICD-10-CM | POA: Diagnosis not present

## 2019-01-15 DIAGNOSIS — M2550 Pain in unspecified joint: Secondary | ICD-10-CM | POA: Diagnosis not present

## 2019-04-02 ENCOUNTER — Other Ambulatory Visit: Payer: Self-pay | Admitting: Hematology and Oncology

## 2019-04-02 DIAGNOSIS — Z9889 Other specified postprocedural states: Secondary | ICD-10-CM

## 2019-04-28 ENCOUNTER — Ambulatory Visit
Admission: RE | Admit: 2019-04-28 | Discharge: 2019-04-28 | Disposition: A | Discharge status: Home / Self Care | Payer: BC Managed Care – PPO | Source: Ambulatory Visit | Attending: Hematology and Oncology | Admitting: Hematology and Oncology

## 2019-04-28 ENCOUNTER — Other Ambulatory Visit: Payer: Self-pay

## 2019-04-28 DIAGNOSIS — Z853 Personal history of malignant neoplasm of breast: Secondary | ICD-10-CM | POA: Diagnosis not present

## 2019-04-28 DIAGNOSIS — Z9889 Other specified postprocedural states: Secondary | ICD-10-CM

## 2019-04-28 DIAGNOSIS — R922 Inconclusive mammogram: Secondary | ICD-10-CM | POA: Diagnosis not present

## 2019-05-20 DIAGNOSIS — Z853 Personal history of malignant neoplasm of breast: Secondary | ICD-10-CM | POA: Diagnosis not present

## 2019-05-20 DIAGNOSIS — Z130 Encounter for screening for diseases of the blood and blood-forming organs and certain disorders involving the immune mechanism: Secondary | ICD-10-CM | POA: Diagnosis not present

## 2019-05-20 DIAGNOSIS — R87810 Cervical high risk human papillomavirus (HPV) DNA test positive: Secondary | ICD-10-CM | POA: Diagnosis not present

## 2019-05-20 DIAGNOSIS — Z01419 Encounter for gynecological examination (general) (routine) without abnormal findings: Secondary | ICD-10-CM | POA: Diagnosis not present

## 2019-05-20 DIAGNOSIS — Z124 Encounter for screening for malignant neoplasm of cervix: Secondary | ICD-10-CM | POA: Diagnosis not present

## 2019-05-20 DIAGNOSIS — Z1151 Encounter for screening for human papillomavirus (HPV): Secondary | ICD-10-CM | POA: Diagnosis not present

## 2019-05-20 DIAGNOSIS — Z975 Presence of (intrauterine) contraceptive device: Secondary | ICD-10-CM | POA: Diagnosis not present

## 2019-06-08 DIAGNOSIS — N72 Inflammatory disease of cervix uteri: Secondary | ICD-10-CM | POA: Diagnosis not present

## 2019-06-08 DIAGNOSIS — N879 Dysplasia of cervix uteri, unspecified: Secondary | ICD-10-CM | POA: Diagnosis not present

## 2019-06-08 DIAGNOSIS — Z3202 Encounter for pregnancy test, result negative: Secondary | ICD-10-CM | POA: Diagnosis not present

## 2019-10-07 NOTE — Progress Notes (Signed)
Patient Care Team: Patient, [name] as PCP - General (General Practice) Jake Shark, Johny Blamer, NP as Nurse Practitioner (Hematology and Oncology) Gery Pray, MD as Consulting Physician (Radiation Oncology) Nicholas Lose, MD as Consulting Physician (Hematology and Oncology) Rolm Bookbinder, MD as Consulting Physician (General Surgery)  DIAGNOSIS:    ICD-10-CM   1. Malignant neoplasm of upper-outer quadrant of left breast in female, estrogen receptor positive ([place])  C50.412    Z17.0     SUMMARY OF ONCOLOGIC HISTORY: Oncology History  Breast cancer of upper-outer quadrant of left female breast ([place])  03/03/2015 Mammogram   An area of distortion is confirmed within the upper-outer quadrant of the left breast, at posterior depth   03/03/2015 Breast US   Spiculated hypoechoic mass within the left breast at the 1 o'clock axis, 6 cm from the nipple, measuring 1.6 x 1.2 x 1 cm, corresponding to the mammographic finding.   03/08/2015 Initial Diagnosis   Left breast 1:00 biopsy: Invasive ductal carcinoma with DCIS, grade 1-2, ER 90%, PR 100%, HER-2 negative ratio 1.35, Ki-67 5%, 1.6 x 1.2 x 1 cm left breast distortion   03/08/2015 Clinical Stage   Stage IA: T1c N0   03/24/2015 Surgery   Left Lumpectomy: IDC grade 1, 1.7 cm, 0/6 LN neg ER 90%, PR 100%, HER-2 negative ratio 1.35, Ki-67 5%   03/24/2015 Pathologic Stage   Stage IA: T1c N0   03/24/2015 Oncotype testing   RS 13 (8% ROR)   05/09/2015 - 06/23/2015 Radiation Therapy   Adjuvant XRT: The Left breast was treated to 50.4 Gy in 28 fractions at 1.8 Gy per fraction. The Left breast was boosted to 12 Gy in 6 fractions at 2 Gy per fraction.   07/04/2015 -  Anti-estrogen oral therapy   Tamoxifen 20 mg daily 5 years   09/02/2015 Survivorship   SCP mailed to patient in lieu of in person visit     CHIEF COMPLIANT: Follow-up of left breast cancer on tamoxifen therapy  INTERVAL HISTORY: Bethany Valdez is a 54 y.o. with above-mentioned  history of left breast cancer treated with lumpectomy, radiation, and who is currently on anti-estrogen therapy with tamoxifen. Mammogram on 04/28/19 showed no evidence of malignancy bilaterally. She presents to the clinic today for annual follow-up.   ALLERGIES:  has No Known Allergies.  MEDICATIONS:  Current Outpatient Medications  Medication Sig Dispense Refill  . Calcium Carbonate-Vitamin D (CALCIUM-VITAMIN D) 500-200 MG-UNIT tablet Take 1 tablet by mouth daily.    . tamoxifen (NOLVADEX) 20 MG tablet Take 1 tablet (20 mg total) by mouth daily. 90 tablet 3   No current facility-administered medications for this visit.    PHYSICAL EXAMINATION: ECOG PERFORMANCE STATUS: 1 - Symptomatic but completely ambulatory  Vitals:   10/08/19 0959  BP: 127/83  Pulse: 74  Resp: 18  Temp: 98.8 F (37.1 C)  SpO2: 100%   Filed Weights   10/08/19 0959  Weight: 158 lb 12.8 oz (72 kg)    BREAST: No palpable masses or nodules in either right or left breasts. No palpable axillary supraclavicular or infraclavicular adenopathy no breast tenderness or nipple discharge. (exam performed in the presence of a chaperone)  LABORATORY DATA:  I have reviewed the data as listed CMP Latest Ref Rng & Units 03/16/2015  Glucose 70 - 140 mg/dl 95  BUN 7.0 - 26.0 mg/dL 9.3  Creatinine 0.6 - 1.1 mg/dL 0.8  Sodium 136 - 145 mEq/L 142  Potassium 3.5 - 5.1 mEq/L 3.9  CO2  22 - 29 mEq/L 27  Calcium 8.4 - 10.4 mg/dL 9.3  Total Protein 6.4 - 8.3 g/dL 6.9  Total Bilirubin 0.20 - 1.20 mg/dL 1.22(H)  Alkaline Phos 40 - 150 U/L 59  AST 5 - 34 U/L 16  ALT 0 - 55 U/L 13    Lab Results  Component Value Date   WBC 10.7 (H) 03/16/2015   HGB 14.4 03/16/2015   HCT 42.7 03/16/2015   MCV 99.4 03/16/2015   PLT 263 03/16/2015   NEUTROABS 7.0 (H) 03/16/2015    ASSESSMENT & PLAN:  Breast cancer of upper-outer quadrant of left female breast ([place]) Left Lumpectomy 03/24/15: IDC grade 1, 1.7 cm, 0/6 LN neg ER 90%, PR 100%,  HER-2 negative ratio 1.35, Ki-67 5% T1C N0 (Stage 1A) Oncotype DX score 13, 8% risk of recurrence Adjuvant radiation therapy 05/09/2015 to 06/23/2015 Current treatment: Tamoxifen 20 mg daily started 07/04/2015  Tamoxifen toxicities: 1. Night sweats: Much improved. 2.Mild aches and pains. Patient is back to running and this appears to be helping her symptoms.  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  Her son had emergency brain surgery and has had a full recovery and she is glad about that.  Surveillance: Mammogram  04/28/2019: Benign breast density category C Breast exam 10/08/2019: Benign  Return to clinic in1 year for follow-up    No orders of the defined types were placed in this encounter.  The patient has a good understanding of the overall plan. she agrees with it. she will call with any problems that may develop before the next visit here.  Total time spent: 20 mins including face to face time and time spent for planning, charting and coordination of care  Nicholas Lose, MD 10/08/2019  I, Cloyde Reams Dorshimer, am acting as scribe for Dr. Nicholas Lose.  I have reviewed the above documentation for accuracy and completeness, and I agree with the above.

## 2019-10-08 ENCOUNTER — Telehealth: Payer: Self-pay | Admitting: Hematology and Oncology

## 2019-10-08 ENCOUNTER — Inpatient Hospital Stay: Payer: BC Managed Care – PPO | Attending: Hematology and Oncology | Admitting: Hematology and Oncology

## 2019-10-08 ENCOUNTER — Other Ambulatory Visit: Payer: Self-pay

## 2019-10-08 DIAGNOSIS — R61 Generalized hyperhidrosis: Secondary | ICD-10-CM | POA: Diagnosis not present

## 2019-10-08 DIAGNOSIS — Z170 Estrogen receptor positive status [ER+]: Secondary | ICD-10-CM | POA: Diagnosis not present

## 2019-10-08 DIAGNOSIS — C50412 Malignant neoplasm of upper-outer quadrant of left female breast: Secondary | ICD-10-CM | POA: Insufficient documentation

## 2019-10-08 DIAGNOSIS — Z79810 Long term (current) use of selective estrogen receptor modulators (SERMs): Secondary | ICD-10-CM | POA: Diagnosis not present

## 2019-10-08 MED ORDER — TAMOXIFEN CITRATE 20 MG PO TABS: 20.0000 mg | ORAL_TABLET | Freq: Every day | ORAL | 3 refills | Status: DC

## 2019-10-08 NOTE — Telephone Encounter (Signed)
Scheduled appts per 8/12 los. Gave pt a print out of AVS.  °

## 2019-10-08 NOTE — Assessment & Plan Note (Signed)
Left Lumpectomy 03/24/15: IDC grade 1, 1.7 cm, 0/6 LN neg ER 90%, PR 100%, HER-2 negative ratio 1.35, Ki-67 5% T1C N0 (Stage 1A) Oncotype DX score 13, 8% risk of recurrence Adjuvant radiation therapy 05/09/2015 to 06/23/2015 Current treatment: Tamoxifen 20 mg daily started 07/04/2015  Tamoxifen toxicities: 1. Night sweats: Much improved. 2.Mild aches and pains. Patient is back to running and this appears to be helping her symptoms.  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  Surveillance: Mammogram  04/28/2019: Benign breast density category C Breast exam 10/08/2019: Benign  Return to clinic in1 year for follow-up

## 2020-04-14 DIAGNOSIS — S20211A Contusion of right front wall of thorax, initial encounter: Secondary | ICD-10-CM | POA: Diagnosis not present

## 2020-04-14 DIAGNOSIS — S2231XA Fracture of one rib, right side, initial encounter for closed fracture: Secondary | ICD-10-CM | POA: Diagnosis not present

## 2020-04-28 ENCOUNTER — Other Ambulatory Visit: Payer: Self-pay | Admitting: Hematology and Oncology

## 2020-04-28 DIAGNOSIS — Z9889 Other specified postprocedural states: Secondary | ICD-10-CM

## 2020-06-17 ENCOUNTER — Other Ambulatory Visit: Payer: Self-pay

## 2020-06-17 ENCOUNTER — Ambulatory Visit
Admission: RE | Admit: 2020-06-17 | Discharge: 2020-06-17 | Disposition: A | Discharge status: Home / Self Care | Payer: BC Managed Care – PPO | Source: Ambulatory Visit | Attending: Hematology and Oncology | Admitting: Hematology and Oncology

## 2020-06-17 ENCOUNTER — Other Ambulatory Visit: Payer: Self-pay | Admitting: Hematology and Oncology

## 2020-06-17 DIAGNOSIS — Z9889 Other specified postprocedural states: Secondary | ICD-10-CM

## 2020-06-17 DIAGNOSIS — Z1231 Encounter for screening mammogram for malignant neoplasm of breast: Secondary | ICD-10-CM | POA: Diagnosis not present

## 2020-06-21 DIAGNOSIS — N3000 Acute cystitis without hematuria: Secondary | ICD-10-CM | POA: Diagnosis not present

## 2020-06-28 DIAGNOSIS — R87810 Cervical high risk human papillomavirus (HPV) DNA test positive: Secondary | ICD-10-CM | POA: Diagnosis not present

## 2020-06-28 DIAGNOSIS — Z30431 Encounter for routine checking of intrauterine contraceptive device: Secondary | ICD-10-CM | POA: Diagnosis not present

## 2020-06-28 DIAGNOSIS — Z130 Encounter for screening for diseases of the blood and blood-forming organs and certain disorders involving the immune mechanism: Secondary | ICD-10-CM | POA: Diagnosis not present

## 2020-06-28 DIAGNOSIS — L292 Pruritus vulvae: Secondary | ICD-10-CM | POA: Diagnosis not present

## 2020-06-28 DIAGNOSIS — Z01419 Encounter for gynecological examination (general) (routine) without abnormal findings: Secondary | ICD-10-CM | POA: Diagnosis not present

## 2020-06-28 DIAGNOSIS — Z780 Asymptomatic menopausal state: Secondary | ICD-10-CM | POA: Diagnosis not present

## 2020-08-17 ENCOUNTER — Encounter: Payer: Self-pay | Admitting: Gastroenterology

## 2020-09-29 ENCOUNTER — Ambulatory Visit (AMBULATORY_SURGERY_CENTER): Payer: BC Managed Care – PPO

## 2020-09-29 ENCOUNTER — Other Ambulatory Visit: Payer: Self-pay

## 2020-09-29 VITALS — Ht 62.0 in | Wt 150.0 lb

## 2020-09-29 DIAGNOSIS — Z1211 Encounter for screening for malignant neoplasm of colon: Secondary | ICD-10-CM

## 2020-09-29 MED ORDER — PEG-KCL-NACL-NASULF-NA ASC-C 100 G PO SOLR: 1.0000 | Freq: Once | ORAL | 0 refills | Status: AC

## 2020-09-29 NOTE — Progress Notes (Signed)
Pre visit completed via phone call; Patient verified name, DOB, and address; No egg or soy allergy known to patient  No issues with past sedation with any surgeries or procedures Patient denies ever being told they had issues or difficulty with intubation  No FH of Malignant Hyperthermia No diet pills per patient No home 02 use per patient  No blood thinners per patient  Pt denies issues with constipation  No A fib or A flutter  EMMI video via MyChart  COVID 19 guidelines implemented in PV today with Pt and RN  Pt is fully vaccinated for Covid x 2 + booster;;   NO PA's for preps discussed with pt in [facility] today  Discussed with pt there will be an out-of-pocket cost for prep and that varies from $0 to 70 dollars   Due to the COVID-19 pandemic we are asking patients to follow certain guidelines.  Pt aware of COVID protocols and [facility] guidelines

## 2020-10-05 ENCOUNTER — Other Ambulatory Visit: Payer: Self-pay | Admitting: Hematology and Oncology

## 2020-10-05 DIAGNOSIS — C50412 Malignant neoplasm of upper-outer quadrant of left female breast: Secondary | ICD-10-CM

## 2020-10-05 NOTE — Progress Notes (Signed)
Patient Care Team: Lennie Odor, [place] as PCP - General (Physician Assistant) Sylvan Cheese, NP as Nurse Practitioner (Hematology and Oncology) Gery Pray, MD as Consulting Physician (Radiation Oncology) Bethany Lose, MD as Consulting Physician (Hematology and Oncology) Rolm Bookbinder, MD as Consulting Physician (General Surgery)  DIAGNOSIS:    ICD-10-CM   1. Malignant neoplasm of upper-outer quadrant of left breast in female, estrogen receptor positive ([place])  C50.412    Z17.0       SUMMARY OF ONCOLOGIC HISTORY: Oncology History  Breast cancer of upper-outer quadrant of left female breast ([place])  03/03/2015 Mammogram   An area of distortion is confirmed within the upper-outer quadrant of the left breast, at posterior depth   03/03/2015 Breast US   Spiculated hypoechoic mass within the left breast at the 1 o'clock axis, 6 cm from the nipple, measuring 1.6 x 1.2 x 1 cm, corresponding to the mammographic finding.   03/08/2015 Initial Diagnosis   Left breast 1:00 biopsy: Invasive ductal carcinoma with DCIS, grade 1-2, ER 90%, PR 100%, HER-2 negative ratio 1.35, Ki-67 5%, 1.6 x 1.2 x 1 cm left breast distortion   03/08/2015 Clinical Stage   Stage IA: T1c N0   03/24/2015 Surgery   Left Lumpectomy: IDC grade 1, 1.7 cm, 0/6 LN neg ER 90%, PR 100%, HER-2 negative ratio 1.35, Ki-67 5%   03/24/2015 Pathologic Stage   Stage IA: T1c N0   03/24/2015 Oncotype testing   RS 13 (8% ROR)   05/09/2015 - 06/23/2015 Radiation Therapy   Adjuvant XRT: The Left breast was treated to 50.4 Gy in 28 fractions at 1.8 Gy per fraction. The Left breast was boosted to 12 Gy in 6 fractions at 2 Gy per fraction.   07/04/2015 -  Anti-estrogen oral therapy   Tamoxifen 20 mg daily 10 years   09/02/2015 Survivorship   SCP mailed to patient in lieu of in person visit     CHIEF COMPLIANT: Follow-up of left breast cancer on tamoxifen therapy  INTERVAL HISTORY: Bethany Valdez is a 55 y.o. with  above-mentioned history of left breast cancer treated with lumpectomy, radiation, and who is currently on anti-estrogen therapy with tamoxifen. Mammogram on 06/17/20 showed no evidence of malignancy bilaterally. She presents to the clinic today for annual follow-up.  Her major complaint is night sweats which is waking her up at night.  She did not have these until the last 3 months.  ALLERGIES:  has No Known Allergies.  MEDICATIONS:  Current Outpatient Medications  Medication Sig Dispense Refill   Calcium Carbonate-Vitamin D (CALCIUM-VITAMIN D) 500-200 MG-UNIT tablet Take 1 tablet by mouth daily.     tamoxifen (NOLVADEX) 20 MG tablet TAKE 1 TABLET BY MOUTH EVERY DAY 90 tablet 3   No current facility-administered medications for this visit.    PHYSICAL EXAMINATION: ECOG PERFORMANCE STATUS: 1 - Symptomatic but completely ambulatory  Vitals:   10/06/20 1534  BP: 121/71  Pulse: 74  Resp: 18  Temp: 99 F (37.2 C)  SpO2: 100%   Filed Weights   10/06/20 1534  Weight: 154 lb 14.4 oz (70.3 kg)    BREAST: No palpable masses or nodules in either right or left breasts. No palpable axillary supraclavicular or infraclavicular adenopathy no breast tenderness or nipple discharge. (exam performed in the presence of a chaperone)  LABORATORY DATA:  I have reviewed the data as listed CMP Latest Ref Rng & Units 03/16/2015  Glucose 70 - 140 mg/dl 95  BUN 7.0 - 26.0 mg/dL  9.3  Creatinine 0.6 - 1.1 mg/dL 0.8  Sodium 136 - 145 mEq/L 142  Potassium 3.5 - 5.1 mEq/L 3.9  CO2 22 - 29 mEq/L 27  Calcium 8.4 - 10.4 mg/dL 9.3  Total Protein 6.4 - 8.3 g/dL 6.9  Total Bilirubin 0.20 - 1.20 mg/dL 1.22(H)  Alkaline Phos 40 - 150 U/L 59  AST 5 - 34 U/L 16  ALT 0 - 55 U/L 13    Lab Results  Component Value Date   WBC 10.7 (H) 03/16/2015   HGB 14.4 03/16/2015   HCT 42.7 03/16/2015   MCV 99.4 03/16/2015   PLT 263 03/16/2015   NEUTROABS 7.0 (H) 03/16/2015    ASSESSMENT & PLAN:  Breast cancer of  upper-outer quadrant of left female breast ([place]) Left Lumpectomy 03/24/15: IDC grade 1, 1.7 cm, 0/6 LN neg ER 90%, PR 100%, HER-2 negative ratio 1.35, Ki-67 5% T1C N0 (Stage 1A) Oncotype DX score 13, 8% risk of recurrence Adjuvant radiation therapy 05/09/2015 to 06/23/2015 Current treatment: Tamoxifen 20 mg daily started 07/04/2015   Tamoxifen toxicities: 1. Night sweats : That improved in the past but then they came back.  I instructed her to take tamoxifen in the daytime..   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   Her son had emergency brain surgery 2 years ago and made a full recovery     Surveillance: Mammogram 06/20/2020: Benign breast density category C Breast exam 10/06/2020: Benign   Return to clinic in 1 year for follow-up    No orders of the defined types were placed in this encounter.  The patient has a good understanding of the overall plan. she agrees with it. she will call with any problems that may develop before the next visit here.  Total time spent: 20 mins including face to face time and time spent for planning, charting and coordination of care  Rulon Eisenmenger, MD, MPH 10/06/2020  I, Thana Ates, am acting as scribe for Dr. Nicholas Valdez.  I have reviewed the above documentation for accuracy and completeness, and I agree with the above.

## 2020-10-06 ENCOUNTER — Other Ambulatory Visit: Payer: Self-pay

## 2020-10-06 ENCOUNTER — Inpatient Hospital Stay: Payer: BC Managed Care – PPO | Attending: Hematology and Oncology | Admitting: Hematology and Oncology

## 2020-10-06 DIAGNOSIS — Z79810 Long term (current) use of selective estrogen receptor modulators (SERMs): Secondary | ICD-10-CM | POA: Diagnosis not present

## 2020-10-06 DIAGNOSIS — R61 Generalized hyperhidrosis: Secondary | ICD-10-CM | POA: Insufficient documentation

## 2020-10-06 DIAGNOSIS — C50412 Malignant neoplasm of upper-outer quadrant of left female breast: Secondary | ICD-10-CM

## 2020-10-06 DIAGNOSIS — Z170 Estrogen receptor positive status [ER+]: Secondary | ICD-10-CM | POA: Diagnosis not present

## 2020-10-06 NOTE — Assessment & Plan Note (Signed)
Left Lumpectomy 03/24/15: IDC grade 1, 1.7 cm, 0/6 LN neg ER 90%, PR 100%, HER-2 negative ratio 1.35, Ki-67 5% T1C N0 (Stage 1A) Oncotype DX score 13, 8% risk of recurrence Adjuvant radiation therapy 05/09/2015 to 06/23/2015 Current treatment: Tamoxifen 20 mg daily started 07/04/2015  Tamoxifen toxicities: 1. Night sweats: That improved in the past but then they came back.  I instructed her to take tamoxifen in the daytime..  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  Her son had emergency brain surgery 2 years ago and made a full recovery    Surveillance: Mammogram4/25/2022: Benign breast density category C Breast exam 10/06/2020: Benign  Return to clinic in1 year for follow-up

## 2020-10-12 ENCOUNTER — Encounter: Payer: Self-pay | Admitting: Gastroenterology

## 2020-10-14 ENCOUNTER — Ambulatory Visit (AMBULATORY_SURGERY_CENTER): Payer: BC Managed Care – PPO | Admitting: Gastroenterology

## 2020-10-14 ENCOUNTER — Other Ambulatory Visit: Payer: Self-pay

## 2020-10-14 ENCOUNTER — Encounter: Payer: Self-pay | Admitting: Gastroenterology

## 2020-10-14 VITALS — BP 135/75 | HR 74 | Temp 98.1°F | Resp 17 | Ht 62.0 in | Wt 150.0 lb

## 2020-10-14 DIAGNOSIS — K5730 Diverticulosis of large intestine without perforation or abscess without bleeding: Secondary | ICD-10-CM

## 2020-10-14 DIAGNOSIS — D124 Benign neoplasm of descending colon: Secondary | ICD-10-CM

## 2020-10-14 DIAGNOSIS — Z1211 Encounter for screening for malignant neoplasm of colon: Secondary | ICD-10-CM | POA: Diagnosis not present

## 2020-10-14 DIAGNOSIS — K635 Polyp of colon: Secondary | ICD-10-CM | POA: Diagnosis not present

## 2020-10-14 DIAGNOSIS — K640 First degree hemorrhoids: Secondary | ICD-10-CM

## 2020-10-14 MED ORDER — SODIUM CHLORIDE 0.9 % IV SOLN: 500.0000 mL | Freq: Once | INTRAVENOUS | Status: DC

## 2020-10-14 NOTE — Progress Notes (Signed)
Vs by [place].  Previsit over phone.

## 2020-10-14 NOTE — Progress Notes (Signed)
Called to room to assist during endoscopic procedure.  Patient ID and intended procedure confirmed with present staff. Received instructions for my participation in the procedure from the performing physician.  

## 2020-10-14 NOTE — Progress Notes (Signed)
GASTROENTEROLOGY PROCEDURE H&P NOTE   Primary Care Physician: Lennie Odor, PA    Reason for Procedure:   Colon cancer screening   Plan:    Colonoscopy  Patient is appropriate for endoscopic procedure(s) in the ambulatory ([facility]) setting.  The nature of the procedure, as well as the risks, benefits, and alternatives were carefully and thoroughly reviewed with the patient. Ample time for discussion and questions allowed. The patient understood, was satisfied, and agreed to proceed.     HPI: Bethany Valdez is a 55 y.o. female who presents for initial average risk CRC screening. No active GI sxs and no known Fhx of CRC. Did have a colonoscopy in her 45's for GI sxs at that time, and normal per patient.   Past Medical History:  Diagnosis Date   Anxiety    due to diagnosis   Breast cancer of upper-outer quadrant of left female breast ([place]) 03/10/2015   Hx of migraines    Personal history of radiation therapy    Radiation 05/09/15-06/23/15   left breast 50.4 Gy boosted to 12 Gy   Stroke [facility]) 2003    Past Surgical History:  Procedure Laterality Date   BREAST LUMPECTOMY Left 2017   [place] LYMPH NODE BIOPSY Left 03/24/2015   Procedure: LEFT BREAST SEED GUIDED LUMPECTOMY WITH LEFT AXILLARY SENTINEL NODE BIOPSY;  Surgeon: Rolm Bookbinder, MD;  Location: [facility];  Service: General;  Laterality: Left;   WISDOM TOOTH EXTRACTION      Prior to Admission medications   Medication Sig Start Date End Date Taking? Authorizing Provider  Calcium Carbonate-Vitamin D (CALCIUM-VITAMIN D) 500-200 MG-UNIT tablet Take 1 tablet by mouth daily.   Yes [provider]  tamoxifen (NOLVADEX) 20 MG tablet TAKE 1 TABLET BY MOUTH EVERY DAY 10/05/20  Yes Nicholas Lose, MD    Current Outpatient Medications  Medication Sig Dispense Refill   Calcium Carbonate-Vitamin D (CALCIUM-VITAMIN D)  500-200 MG-UNIT tablet Take 1 tablet by mouth daily.     tamoxifen (NOLVADEX) 20 MG tablet TAKE 1 TABLET BY MOUTH EVERY DAY 90 tablet 3   Current Facility-Administered Medications  Medication Dose Route Frequency Provider Last Rate Last Admin   0.9 %  sodium chloride infusion  500 mL Intravenous Once Ashmi Blas V, DO        Allergies as of 10/14/2020   (No Known Allergies)    Family History  Problem Relation Age of Onset   Colon polyps Neg Hx    Esophageal cancer Neg Hx    Stomach cancer Neg Hx    Rectal cancer Neg Hx     Social History   Socioeconomic History   Marital status: Divorced    Spouse name: Not on file   Number of children: Not on file   Years of education: Not on file   Highest education level: Not on file  Occupational History   Not on file  Tobacco Use   Smoking status: Never   Smokeless tobacco: Never  Vaping Use   Vaping Use: Never used  Substance and Sexual Activity   Alcohol use: Yes    Alcohol/week: 1.0 standard drink    Types: 1 Standard drinks or equivalent per week    Comment: social   Drug use: No   Sexual activity: Not Currently  Other Topics Concern   Not on file  Social History Narrative   Not on file  Social Determinants of Health   Financial Resource Strain: Not on file  Food Insecurity: Not on file  Transportation Needs: Not on file  Physical Activity: Not on file  Stress: Not on file  Social Connections: Not on file  Intimate Partner Violence: Not on file    Physical Exam: Vital signs in last 24 hours: '@BP'$  128/80   Pulse 82   Temp 98.1 F (36.7 C)   Ht '5\' 2"'$  (1.575 m)   Wt 150 lb (68 kg)   LMP  (LMP Unknown) Comment: taking tamoxofen  SpO2 98%   BMI 27.44 kg/m  GEN: NAD EYE: Sclerae anicteric ENT: MMM CV: Non-tachycardic Pulm: CTA b/l GI: Soft, NT/ND NEURO:  Alert & Oriented x 3   Gerrit Heck, DO [facility]   10/14/2020 11:21 AM

## 2020-10-14 NOTE — Patient Instructions (Signed)
Handouts given for polyps and diverticulosis.  YOU HAD AN ENDOSCOPIC PROCEDURE TODAY AT THE [facility]:   Refer to the procedure report that was given to you for any specific questions about what was found during the examination.  If the procedure report does not answer your questions, please call your gastroenterologist to clarify.  If you requested that your care partner not be given the details of your procedure findings, then the procedure report has been included in a sealed envelope for you to review at your convenience later.  YOU SHOULD EXPECT: Some feelings of bloating in the abdomen. Passage of more gas than usual.  Walking can help get rid of the air that was put into your GI tract during the procedure and reduce the bloating. If you had a lower endoscopy (such as a colonoscopy or flexible sigmoidoscopy) you may notice spotting of blood in your stool or on the toilet paper. If you underwent a bowel prep for your procedure, you may not have a normal bowel movement for a few days.  Please Note:  You might notice some irritation and congestion in your nose or some drainage.  This is from the oxygen used during your procedure.  There is no need for concern and it should clear up in a day or so.  SYMPTOMS TO REPORT IMMEDIATELY:  Following lower endoscopy (colonoscopy or flexible sigmoidoscopy):  Excessive amounts of blood in the stool  Significant tenderness or worsening of abdominal pains  Swelling of the abdomen that is new, acute  Fever of 100F or higher  For urgent or emergent issues, a gastroenterologist can be reached at any hour by calling [phone]. Do not use MyChart messaging for urgent concerns.    DIET:  We do recommend a small meal at first, but then you may proceed to your regular diet.  Drink plenty of fluids but you should avoid alcoholic beverages for 24 hours.  ACTIVITY:  You should plan to take it easy for the rest of today and you should NOT DRIVE  or use heavy machinery until tomorrow (because of the sedation medicines used during the test).    FOLLOW UP: Our staff will call the number listed on your records 48-72 hours following your procedure to check on you and address any questions or concerns that you may have regarding the information given to you following your procedure. If we do not reach you, we will leave a message.  We will attempt to reach you two times.  During this call, we will ask if you have developed any symptoms of COVID 19. If you develop any symptoms (ie: fever, flu-like symptoms, shortness of breath, cough etc.) before then, please call [phone].  If you test positive for Covid 19 in the 2 weeks post procedure, please call and report this information to us.    If any biopsies were taken you will be contacted by phone or by letter within the next 1-3 weeks.  Please call us at [phone] if you have not heard about the biopsies in 3 weeks.    SIGNATURES/CONFIDENTIALITY: You and/or your care partner have signed paperwork which will be entered into your electronic medical record.  These signatures attest to the fact that that the information above on your After Visit Summary has been reviewed and is understood.  Full responsibility of the confidentiality of this discharge information lies with you and/or your care-partner.  

## 2020-10-14 NOTE — Op Note (Signed)
[facility] Patient Name: Bethany Valdez Procedure Date: 10/14/2020 11:15 AM MRN: [id number] Endoscopist: Gerrit Heck , MD Age: 55 Referring MD:  Date of Birth: Nov 13, 1965 Gender: Female Account #: [id number] Procedure:                Colonoscopy Indications:              Screening for colorectal malignant neoplasm Medicines:                Monitored Anesthesia Care Procedure:                Pre-Anesthesia Assessment:                           - Prior to the procedure, a History and Physical                            was performed, and patient medications and                            allergies were reviewed. The patient's tolerance of                            previous anesthesia was also reviewed. The risks                            and benefits of the procedure and the sedation                            options and risks were discussed with the patient.                            All questions were answered, and informed consent                            was obtained. Prior Anticoagulants: The patient has                            taken no previous anticoagulant or antiplatelet                            agents. ASA Grade Assessment: II - A patient with                            mild systemic disease. After reviewing the risks                            and benefits, the patient was deemed in                            satisfactory condition to undergo the procedure.                           After obtaining informed consent, the colonoscope  was passed under direct vision. Throughout the                            procedure, the patient's blood pressure, pulse, and                            oxygen saturations were monitored continuously. The                            CF [id number] was introduced through the anus                            and advanced to the the cecum, identified by                            appendiceal orifice  and ileocecal valve. The                            colonoscopy was performed without difficulty. The                            patient tolerated the procedure well. The quality                            of the bowel preparation was good. The ileocecal                            valve, appendiceal orifice, and rectum were                            photographed. Scope In: 11:28:30 AM Scope Out: 11:42:04 AM Scope Withdrawal Time: 0 hours 9 minutes 1 second  Total Procedure Duration: 0 hours 13 minutes 34 seconds  Findings:                 The perianal and digital rectal examinations were                            normal.                           A 4 mm polyp was found in the descending colon. The                            polyp was sessile. The polyp was removed with a                            cold snare. Resection and retrieval were complete.                            Estimated blood loss was minimal.                           A few small-mouthed diverticula were found in the  sigmoid colon and ascending colon. The remainder of                            the colon was otherwise normal appearing.                           Non-bleeding internal hemorrhoids were found during                            retroflexion. The hemorrhoids were small and Grade                            I (internal hemorrhoids that do not prolapse). Complications:            No immediate complications. Estimated Blood Loss:     Estimated blood loss was minimal. Impression:               - One 4 mm polyp in the descending colon, removed                            with a cold snare. Resected and retrieved.                           - Diverticulosis in the sigmoid colon and in the                            ascending colon.                           - Non-bleeding internal hemorrhoids. Recommendation:           - Patient has a contact number available for                             emergencies. The signs and symptoms of potential                            delayed complications were discussed with the                            patient. Return to normal activities tomorrow.                            Written discharge instructions were provided to the                            patient.                           - Resume previous diet.                           - Continue present medications.                           - Await pathology results.                           -  Repeat colonoscopy for surveillance based on                            pathology results.                           - Return to GI clinic PRN. Gerrit Heck, MD 10/14/2020 11:48:41 AM

## 2020-10-14 NOTE — Progress Notes (Signed)
To PACU, VSS. Report to [email or web address] 

## 2020-10-18 ENCOUNTER — Telehealth: Payer: Self-pay | Admitting: *Deleted

## 2020-10-18 NOTE — Telephone Encounter (Signed)
  Follow up Call-  Call back number 10/14/2020  Post procedure Call Back phone  # [phone]  Permission to leave phone message Yes  Some recent data might be hidden     Patient questions:  Do you have a fever, pain , or abdominal swelling? No. Pain Score  0 *  Have you tolerated food without any problems? Yes.    Have you been able to return to your normal activities? Yes.    Do you have any questions about your discharge instructions: Diet   No. Medications  No. Follow up visit  No.  Do you have questions or concerns about your Care? No.  Actions: * If pain score is 4 or above: No action needed, pain <4.  Have you developed a fever since your procedure? no  2.   Have you had an respiratory symptoms (SOB or cough) since your procedure? no  3.   Have you tested positive for COVID 19 since your procedure no  4.   Have you had any family members/close contacts diagnosed with the COVID 19 since your procedure?  no   If yes to any of these questions please route to Joylene John, RN and Joella Prince, RN

## 2020-10-20 ENCOUNTER — Encounter: Payer: Self-pay | Admitting: Gastroenterology

## 2020-10-28 DIAGNOSIS — M67441 Ganglion, right hand: Secondary | ICD-10-CM | POA: Diagnosis not present

## 2020-10-28 DIAGNOSIS — M79642 Pain in left hand: Secondary | ICD-10-CM | POA: Diagnosis not present

## 2020-10-28 DIAGNOSIS — M79641 Pain in right hand: Secondary | ICD-10-CM | POA: Diagnosis not present

## 2020-11-01 DIAGNOSIS — M79641 Pain in right hand: Secondary | ICD-10-CM | POA: Diagnosis not present

## 2020-11-03 DIAGNOSIS — M71341 Other bursal cyst, right hand: Secondary | ICD-10-CM | POA: Diagnosis not present

## 2020-12-02 DIAGNOSIS — M71341 Other bursal cyst, right hand: Secondary | ICD-10-CM | POA: Diagnosis not present

## 2020-12-02 DIAGNOSIS — M67441 Ganglion, right hand: Secondary | ICD-10-CM | POA: Diagnosis not present

## 2020-12-20 DIAGNOSIS — N950 Postmenopausal bleeding: Secondary | ICD-10-CM | POA: Diagnosis not present

## 2020-12-20 DIAGNOSIS — N939 Abnormal uterine and vaginal bleeding, unspecified: Secondary | ICD-10-CM | POA: Diagnosis not present

## 2020-12-20 DIAGNOSIS — N8500 Endometrial hyperplasia, unspecified: Secondary | ICD-10-CM | POA: Diagnosis not present

## 2021-01-04 DIAGNOSIS — N840 Polyp of corpus uteri: Secondary | ICD-10-CM | POA: Diagnosis not present

## 2021-01-04 DIAGNOSIS — T8339XD Other mechanical complication of intrauterine contraceptive device, subsequent encounter: Secondary | ICD-10-CM | POA: Diagnosis not present

## 2021-01-04 DIAGNOSIS — N950 Postmenopausal bleeding: Secondary | ICD-10-CM | POA: Diagnosis not present

## 2021-07-06 DIAGNOSIS — Z853 Personal history of malignant neoplasm of breast: Secondary | ICD-10-CM | POA: Diagnosis not present

## 2021-07-06 DIAGNOSIS — Z01419 Encounter for gynecological examination (general) (routine) without abnormal findings: Secondary | ICD-10-CM | POA: Diagnosis not present

## 2021-07-06 DIAGNOSIS — Z130 Encounter for screening for diseases of the blood and blood-forming organs and certain disorders involving the immune mechanism: Secondary | ICD-10-CM | POA: Diagnosis not present

## 2021-07-06 DIAGNOSIS — R87810 Cervical high risk human papillomavirus (HPV) DNA test positive: Secondary | ICD-10-CM | POA: Diagnosis not present

## 2021-07-06 DIAGNOSIS — Z780 Asymptomatic menopausal state: Secondary | ICD-10-CM | POA: Diagnosis not present

## 2021-07-06 DIAGNOSIS — Z1231 Encounter for screening mammogram for malignant neoplasm of breast: Secondary | ICD-10-CM | POA: Diagnosis not present

## 2021-07-21 DIAGNOSIS — M9901 Segmental and somatic dysfunction of cervical region: Secondary | ICD-10-CM | POA: Diagnosis not present

## 2021-07-21 DIAGNOSIS — M5032 Other cervical disc degeneration, mid-cervical region, unspecified level: Secondary | ICD-10-CM | POA: Diagnosis not present

## 2021-07-21 DIAGNOSIS — M5414 Radiculopathy, thoracic region: Secondary | ICD-10-CM | POA: Diagnosis not present

## 2021-07-21 DIAGNOSIS — M9902 Segmental and somatic dysfunction of thoracic region: Secondary | ICD-10-CM | POA: Diagnosis not present

## 2021-07-21 DIAGNOSIS — M531 Cervicobrachial syndrome: Secondary | ICD-10-CM | POA: Diagnosis not present

## 2021-10-05 ENCOUNTER — Encounter: Payer: Self-pay | Admitting: Family Medicine

## 2021-10-05 ENCOUNTER — Ambulatory Visit: Payer: BC Managed Care – PPO | Admitting: Family Medicine

## 2021-10-05 VITALS — BP 124/82 | HR 92 | Temp 97.8°F | Ht 62.0 in | Wt 164.0 lb

## 2021-10-05 DIAGNOSIS — Z8673 Personal history of transient ischemic attack (TIA), and cerebral infarction without residual deficits: Secondary | ICD-10-CM | POA: Diagnosis not present

## 2021-10-05 DIAGNOSIS — Z853 Personal history of malignant neoplasm of breast: Secondary | ICD-10-CM | POA: Insufficient documentation

## 2021-10-05 DIAGNOSIS — H60502 Unspecified acute noninfective otitis externa, left ear: Secondary | ICD-10-CM | POA: Diagnosis not present

## 2021-10-05 DIAGNOSIS — R0981 Nasal congestion: Secondary | ICD-10-CM | POA: Diagnosis not present

## 2021-10-05 DIAGNOSIS — R42 Dizziness and giddiness: Secondary | ICD-10-CM | POA: Diagnosis not present

## 2021-10-05 LAB — POC COVID19 BINAXNOW: SARS Coronavirus 2 Ag: NEGATIVE

## 2021-10-05 MED ORDER — CIPROFLOXACIN-DEXAMETHASONE 0.3-0.1 % OT SUSP: 4.0000 [drp] | Freq: Two times a day (BID) | OTIC | 0 refills | Status: DC

## 2021-10-05 MED ORDER — AMOXICILLIN-POT CLAVULANATE 875-125 MG PO TABS
1.0000 | ORAL_TABLET | Freq: Two times a day (BID) | ORAL | 0 refills | Status: DC
Start: 2021-10-05 — End: 2021-10-12

## 2021-10-05 MED ORDER — MECLIZINE HCL 25 MG PO TABS: 25.0000 mg | ORAL_TABLET | Freq: Two times a day (BID) | ORAL | 0 refills | Status: DC | PRN

## 2021-10-05 NOTE — Assessment & Plan Note (Signed)
Augmentin and Ciprodex prescribed. F/u 2 wks or sooner if worsening.

## 2021-10-05 NOTE — Assessment & Plan Note (Signed)
Negative Covid test. May use Zyrtec and Flonase.

## 2021-10-05 NOTE — Assessment & Plan Note (Signed)
Followed by Dr. Lindi Adie. On Tamoxifen

## 2021-10-05 NOTE — Assessment & Plan Note (Signed)
Hx of same. Meclizine prescribed. F/u in 4 wks. If not improving will refer to PT or ENT

## 2021-10-05 NOTE — Assessment & Plan Note (Signed)
Unknown cause. No issues since.

## 2021-10-05 NOTE — Progress Notes (Signed)
New Patient Office Visit  Subjective    Patient ID: Bethany Valdez, female    DOB: February 20, 1966  Age: 56 y.o. MRN: [id number]  CC:  Chief Complaint  Patient presents with   Establish Care    Has had draining off and on in her left ear for a few months now, denies pain but is making her dizzy at times.     HPI BRELYNN Valdez presents to establish care  Previous PCP at [facility].   Other providers: OB/GYN - [facility]- Dr. Lindi Adie  GI- Dr. Bryan Lemma   Left ear canal with itching and crusting x 3 months. Pain in left ear started 3 days ago.  She also developed nasal congestion x 3 days. No covid test done.   Vertigo episodes recently. She has been taking Dramamine.   Denies fever, chills, headaches, vision changes, sore throat, chest pain, palpitations, shortness of breath, cough, abdominal pain, N/V/D.    Hx of stroke in 2003.  She has minimal right sided facial weakness   LMP: 2 21/2 years ago  Hot flashes and night sweats   States she has a tough time losing weight now with healthy diet and exercise. She has gained approximately   [employer] with coconut milk  Tuna or salad for lunch Dinner healthy   Runs before work [employer] for [employer]       Outpatient Encounter Medications as of 10/05/2021  Medication Sig   amoxicillin-clavulanate (AUGMENTIN) 875-125 MG tablet Take 1 tablet by mouth 2 (two) times daily.   Calcium Carbonate-Vitamin D (CALCIUM-VITAMIN D) 500-200 MG-UNIT tablet Take 1 tablet by mouth daily.   ciprofloxacin-dexamethasone (CIPRODEX) OTIC suspension Place 4 drops into the left ear 2 (two) times daily.   cyanocobalamin 1000 MCG tablet Take 1,000 mcg by mouth daily.   meclizine (ANTIVERT) 25 MG tablet Take 1 tablet (25 mg total) by mouth 2 (two) times daily as needed for dizziness.   tamoxifen (NOLVADEX) 20 MG tablet TAKE 1 TABLET BY MOUTH EVERY DAY   Turmeric (QC TUMERIC COMPLEX PO)  Take by mouth.   UNABLE TO FIND Take by mouth daily. Med Name: Zannie Cove   No facility-administered encounter medications on file as of 10/05/2021.    Past Medical History:  Diagnosis Date   Anxiety    due to diagnosis   Breast cancer of upper-outer quadrant of left female breast ([place]) 03/10/2015   Hx of migraines    Personal history of radiation therapy    Radiation 05/09/15-06/23/15   left breast 50.4 Gy boosted to 12 Gy   Stroke [facility]) 2003    Past Surgical History:  Procedure Laterality Date   BREAST LUMPECTOMY Left 2017   [place] LYMPH NODE BIOPSY Left 03/24/2015   Procedure: LEFT BREAST SEED GUIDED LUMPECTOMY WITH LEFT AXILLARY SENTINEL NODE BIOPSY;  Surgeon: Rolm Bookbinder, MD;  Location: [place];  Service: General;  Laterality: Left;   WISDOM TOOTH EXTRACTION      Family History  Problem Relation Age of Onset   Colon polyps Neg Hx    Esophageal cancer Neg Hx    Stomach cancer Neg Hx    Rectal cancer Neg Hx     Social History   Socioeconomic History   Marital status: Divorced    Spouse name: Not on file   Number of  children: Not on file   Years of education: Not on file   Highest education level: Not on file  Occupational History   Not on file  Tobacco Use   Smoking status: Never   Smokeless tobacco: Never  Vaping Use   Vaping Use: Never used  Substance and Sexual Activity   Alcohol use: Yes    Alcohol/week: 1.0 standard drink of alcohol    Types: 1 Standard drinks or equivalent per week    Comment: social   Drug use: No   Sexual activity: Not Currently  Other Topics Concern   Not on file  Social History Narrative   Not on file   Social Determinants of Health   Financial Resource Strain: Not on file  Food Insecurity: Not on file  Transportation Needs: Not on file  Physical Activity: Not on file  Stress: Not on file  Social Connections:  Not on file  Intimate Partner Violence: Not on file    ROS Pertinent positives and negatives in the history of present illness.      Objective    BP 124/82 (BP Location: Left Arm, Patient Position: Sitting, Cuff Size: Large)   Pulse 92   Temp 97.8 F (36.6 C) (Temporal)   Ht '5\' 2"'$  (1.575 m)   Wt 164 lb (74.4 kg)   LMP  (LMP Unknown) Comment: taking tamoxofen  SpO2 99%   BMI 30.00 kg/m   Physical Exam Constitutional:      General: She is not in acute distress.    Appearance: She is not ill-appearing.  HENT:     Right Ear: Tympanic membrane, ear canal and external ear normal.     Left Ear: Swelling present.     Ears:     Comments: Left ear canal with dry skin, some crusting and edema. Unable to visualize TM    Nose: Congestion present.     Mouth/Throat:     Mouth: Mucous membranes are moist.     Pharynx: No oropharyngeal exudate or posterior oropharyngeal erythema.  Eyes:     Conjunctiva/sclera: Conjunctivae normal.     Pupils: Pupils are equal, round, and reactive to light.  Cardiovascular:     Rate and Rhythm: Normal rate and regular rhythm.  Pulmonary:     Effort: Pulmonary effort is normal.     Breath sounds: Normal breath sounds.  Musculoskeletal:     Cervical back: Normal range of motion and neck supple. No tenderness.  Lymphadenopathy:     Cervical: No cervical adenopathy.  Neurological:     Mental Status: She is alert.         Assessment & Plan:   Problem List Items Addressed This Visit       Nervous and Auditory   Acute otitis externa of left ear - Primary    Augmentin and Ciprodex prescribed. F/u 2 wks or sooner if worsening.       Relevant Medications   amoxicillin-clavulanate (AUGMENTIN) 875-125 MG tablet   ciprofloxacin-dexamethasone (CIPRODEX) OTIC suspension   Other Relevant Orders   POC COVID-19 (Completed)     Other   History of breast cancer    Followed by Dr. Lindi Adie. On Tamoxifen      History of stroke    Unknown cause. No  issues since.       Nasal congestion    Negative Covid test. May use Zyrtec and Flonase.       Relevant Orders   POC COVID-19 (Completed)   Vertigo  Hx of same. Meclizine prescribed. F/u in 4 wks. If not improving will refer to PT or ENT      Relevant Medications   meclizine (ANTIVERT) 25 MG tablet   Other Relevant Orders   POC COVID-19 (Completed)    Return for f/u 2 wks for ear and vertigo .   Harland Dingwall, NP-C

## 2021-10-05 NOTE — Patient Instructions (Signed)
Otitis Externa  Otitis externa is an infection of the outer ear canal. The outer ear canal is the area between the outside of the ear and the eardrum. Otitis externa is sometimes called swimmer's ear. What are the causes? Common causes of this condition include: Swimming in dirty water. Moisture in the ear. An injury to the inside of the ear. An object stuck in the ear. A cut or scrape on the outside of the ear or in the ear canal. What increases the risk? You are more likely to get this condition if you go swimming often. What are the signs or symptoms? Itching in the ear. This is often the first symptom. Swelling of the ear. Redness in the ear. Ear pain. The pain may get worse when you pull on your ear. Pus coming from the ear. How is this treated? This condition may be treated with: Antibiotic ear drops. These are often given for 10-14 days. Medicines to reduce itching and swelling. Follow these instructions at home: If you were prescribed antibiotic ear drops, use them as told by your doctor. Do not stop using them even if you start to feel better. Take over-the-counter and prescription medicines only as told by your doctor. Avoid getting water in your ears as told by your doctor. You may be told to avoid swimming or water sports for a few days. Keep all follow-up visits. How is this prevented? Keep your ears dry. Use the corner of a towel to dry your ears after you swim or bathe. Try not to scratch or put things in your ear. Doing these things makes it easier for germs to grow in your ear. Avoid swimming in lakes, dirty water, or swimming pools that may not have the right amount of a chemical called chlorine. Contact a doctor if: You have a fever. Your ear is still red, swollen, or painful after 3 days. You still have pus coming from your ear after 3 days. Your redness, swelling, or pain gets worse. You have a very bad headache. Get help right away if: You have redness,  swelling, and pain or tenderness behind your ear. Summary Otitis externa is an infection of the outer ear canal. Symptoms include pain, redness, and swelling of the ear. If you were prescribed antibiotic ear drops, use them as told by your doctor. Do not stop using them even if you start to feel better. Try not to scratch or put things in your ear. This information is not intended to replace advice given to you by your health care provider. Make sure you discuss any questions you have with your health care provider. Document Revised: 04/27/2020 Document Reviewed: 04/27/2020 Elsevier Patient Education  [place].  Vertigo Vertigo is the feeling that you or your surroundings are moving when they are not. This feeling can come and go at any time. Vertigo often goes away on its own. Vertigo can be dangerous if it occurs while you are doing something that could endanger yourself or others, such as driving or operating machinery. Your health care provider will do tests to try to determine the cause of your vertigo. Tests will also help your health care provider decide how best to treat your condition. Follow these instructions at home: Eating and drinking     Dehydration can make vertigo worse. Drink enough fluid to keep your urine pale yellow. Do not drink alcohol. Activity Return to your normal activities as told by your health care provider. Ask your health care provider  what activities are safe for you. In the morning, first sit up on the side of the bed. When you feel okay, stand slowly while you hold onto something until you know that your balance is fine. Move slowly. Avoid sudden body or head movements or certain positions, as told by your health care provider. If you have trouble walking or keeping your balance, try using a cane for stability. If you feel dizzy or unstable, sit down right away. Avoid doing any tasks that would cause danger to you or others if vertigo  occurs. Avoid bending down if you feel dizzy. Place items in your home so that they are easy for you to reach without bending or leaning over. Do not drive or use machinery if you feel dizzy. General instructions Take over-the-counter and prescription medicines only as told by your health care provider. Keep all follow-up visits. This is important. Contact a health care provider if: Your medicines do not relieve your vertigo or they make it worse. Your condition gets worse or you develop new symptoms. You have a fever. You develop nausea or vomiting, or if nausea gets worse. Your family or friends notice any behavioral changes. You have numbness or a prickling and tingling sensation in part of your body. Get help right away if you: Are always dizzy or you faint. Develop severe headaches. Develop a stiff neck. Develop sensitivity to light. Have difficulty moving or speaking. Have weakness in your hands, arms, or legs. Have changes in your hearing or vision. These symptoms may represent a serious problem that is an emergency. Do not wait to see if the symptoms will go away. Get medical help right away. Call your local emergency services (911 in the [place] Do not drive yourself to the hospital. Summary Vertigo is the feeling that you or your surroundings are moving when they are not. Your health care provider will do tests to try to determine the cause of your vertigo. Follow instructions for home care. You may be told to avoid certain tasks, positions, or movements. Contact a health care provider if your medicines do not relieve your symptoms, or if you have a fever, nausea, vomiting, or changes in behavior. Get help right away if you have severe headaches or difficulty speaking, or you develop hearing or vision problems. This information is not intended to replace advice given to you by your health care provider. Make sure you discuss any questions you have with your health care  provider. Document Revised: 01/13/2020 Document Reviewed: 01/13/2020 Elsevier Patient Education  [place].

## 2021-10-11 NOTE — Progress Notes (Signed)
Patient Care Team: Girtha Rm, NP-C as PCP - General (Family Medicine) Sylvan Cheese, NP as Nurse Practitioner (Hematology and Oncology) Gery Pray, MD as Consulting Physician (Radiation Oncology) Nicholas Lose, MD as Consulting Physician (Hematology and Oncology) Rolm Bookbinder, MD as Consulting Physician (General Surgery)  DIAGNOSIS: No diagnosis found.  SUMMARY OF ONCOLOGIC HISTORY: Oncology History  Breast cancer of upper-outer quadrant of left female breast ([place])  03/03/2015 Mammogram   An area of distortion is confirmed within the upper-outer quadrant of the left breast, at posterior depth   03/03/2015 Breast US   Spiculated hypoechoic mass within the left breast at the 1 o'clock axis, 6 cm from the nipple, measuring 1.6 x 1.2 x 1 cm, corresponding to the mammographic finding.   03/08/2015 Initial Diagnosis   Left breast 1:00 biopsy: Invasive ductal carcinoma with DCIS, grade 1-2, ER 90%, PR 100%, HER-2 negative ratio 1.35, Ki-67 5%, 1.6 x 1.2 x 1 cm left breast distortion   03/08/2015 Clinical Stage   Stage IA: T1c N0   03/24/2015 Surgery   Left Lumpectomy: IDC grade 1, 1.7 cm, 0/6 LN neg ER 90%, PR 100%, HER-2 negative ratio 1.35, Ki-67 5%   03/24/2015 Pathologic Stage   Stage IA: T1c N0   03/24/2015 Oncotype testing   RS 13 (8% ROR)   05/09/2015 - 06/23/2015 Radiation Therapy   Adjuvant XRT: The Left breast was treated to 50.4 Gy in 28 fractions at 1.8 Gy per fraction. The Left breast was boosted to 12 Gy in 6 fractions at 2 Gy per fraction.   07/04/2015 -  Anti-estrogen oral therapy   Tamoxifen 20 mg daily 10 years   09/02/2015 Survivorship   SCP mailed to patient in lieu of in person visit     CHIEF COMPLIANT: Follow-up of left breast cancer on tamoxifen therapy    INTERVAL HISTORY: Bethany Valdez is a 56 y.o. with above-mentioned history of left breast cancer treated with lumpectomy, radiation, and who is currently on anti-estrogen therapy with  tamoxifen. She presents to the clinic today for a follow-up.   ALLERGIES:  has No Known Allergies.  MEDICATIONS:  Current Outpatient Medications  Medication Sig Dispense Refill   amoxicillin-clavulanate (AUGMENTIN) 875-125 MG tablet Take 1 tablet by mouth 2 (two) times daily. 20 tablet 0   Calcium Carbonate-Vitamin D (CALCIUM-VITAMIN D) 500-200 MG-UNIT tablet Take 1 tablet by mouth daily.     ciprofloxacin-dexamethasone (CIPRODEX) OTIC suspension Place 4 drops into the left ear 2 (two) times daily. 7.5 mL 0   cyanocobalamin 1000 MCG tablet Take 1,000 mcg by mouth daily.     meclizine (ANTIVERT) 25 MG tablet Take 1 tablet (25 mg total) by mouth 2 (two) times daily as needed for dizziness. 30 tablet 0   tamoxifen (NOLVADEX) 20 MG tablet TAKE 1 TABLET BY MOUTH EVERY DAY 90 tablet 3   Turmeric (QC TUMERIC COMPLEX PO) Take by mouth.     UNABLE TO FIND Take by mouth daily. Med Name: Zannie Cove     No current facility-administered medications for this visit.    PHYSICAL EXAMINATION: ECOG PERFORMANCE STATUS: {CHL ONC ECOG PS:[phone]}  There were no vitals filed for this visit. There were no vitals filed for this visit.  BREAST:*** No palpable masses or nodules in either right or left breasts. No palpable axillary supraclavicular or infraclavicular adenopathy no breast tenderness or nipple discharge. (exam performed in the presence of a chaperone)  LABORATORY DATA:  I have reviewed the data as listed  Latest Ref Rng & Units 03/16/2015   12:35 PM  CMP  Glucose 70 - 140 mg/dl 95   BUN 7.0 - 26.0 mg/dL 9.3   Creatinine 0.6 - 1.1 mg/dL 0.8   Sodium 136 - 145 mEq/L 142   Potassium 3.5 - 5.1 mEq/L 3.9   CO2 22 - 29 mEq/L 27   Calcium 8.4 - 10.4 mg/dL 9.3   Total Protein 6.4 - 8.3 g/dL 6.9   Total Bilirubin 0.20 - 1.20 mg/dL 1.22   Alkaline Phos 40 - 150 U/L 59   AST 5 - 34 U/L 16   ALT 0 - 55 U/L 13     Lab Results  Component Value Date   WBC 10.7 (H) 03/16/2015   HGB 14.4  03/16/2015   HCT 42.7 03/16/2015   MCV 99.4 03/16/2015   PLT 263 03/16/2015   NEUTROABS 7.0 (H) 03/16/2015    ASSESSMENT & PLAN:  No problem-specific Assessment & Plan notes found for this encounter.    No orders of the defined types were placed in this encounter.  The patient has a good understanding of the overall plan. she agrees with it. she will call with any problems that may develop before the next visit here. Total time spent: 30 mins including face to face time and time spent for planning, charting and co-ordination of care   Suzzette Righter, [employer] 10/11/21    I Gardiner Coins am scribing for Dr. Lindi Adie  ***

## 2021-10-12 ENCOUNTER — Other Ambulatory Visit: Payer: Self-pay

## 2021-10-12 ENCOUNTER — Inpatient Hospital Stay: Payer: BC Managed Care – PPO | Attending: Hematology and Oncology | Admitting: Hematology and Oncology

## 2021-10-12 DIAGNOSIS — Z79810 Long term (current) use of selective estrogen receptor modulators (SERMs): Secondary | ICD-10-CM | POA: Insufficient documentation

## 2021-10-12 DIAGNOSIS — C50412 Malignant neoplasm of upper-outer quadrant of left female breast: Secondary | ICD-10-CM | POA: Insufficient documentation

## 2021-10-12 DIAGNOSIS — Z170 Estrogen receptor positive status [ER+]: Secondary | ICD-10-CM | POA: Insufficient documentation

## 2021-10-12 MED ORDER — TAMOXIFEN CITRATE 20 MG PO TABS: 20.0000 mg | ORAL_TABLET | Freq: Every day | ORAL | 3 refills | Status: DC

## 2021-10-12 NOTE — Assessment & Plan Note (Signed)
Left Lumpectomy 03/24/15: IDC grade 1, 1.7 cm, 0/6 LN neg ER 90%, PR 100%, HER-2 negative ratio 1.35, Ki-67 5% T1C N0 (Stage 1A) Oncotype DX score 13, 8% risk of recurrence Adjuvant radiation therapy 05/09/2015 to 06/23/2015 Current treatment: Tamoxifen 20 mg daily started 07/04/2015  Tamoxifen toxicities: 1. Night sweats: That improved in the past but then they came back.  I instructed her to take tamoxifen in the daytime..  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  Her son had emergency brain surgery 3 years ago and made a full recovery    Surveillance: Mammogram4/25/2022: Benignbreast density category C Breast exam  10/12/2021: Benign  Return to clinic in1 year for follow-up

## 2021-10-19 ENCOUNTER — Ambulatory Visit: Payer: BC Managed Care – PPO | Admitting: Family Medicine

## 2021-10-19 ENCOUNTER — Encounter: Payer: Self-pay | Admitting: Family Medicine

## 2021-10-19 VITALS — BP 100/62 | HR 75 | Temp 97.7°F | Ht 62.0 in | Wt 167.0 lb

## 2021-10-19 DIAGNOSIS — R42 Dizziness and giddiness: Secondary | ICD-10-CM

## 2021-10-19 DIAGNOSIS — H60502 Unspecified acute noninfective otitis externa, left ear: Secondary | ICD-10-CM

## 2021-10-19 DIAGNOSIS — Z09 Encounter for follow-up examination after completed treatment for conditions other than malignant neoplasm: Secondary | ICD-10-CM | POA: Diagnosis not present

## 2021-10-19 DIAGNOSIS — H6690 Otitis media, unspecified, unspecified ear: Secondary | ICD-10-CM

## 2021-10-19 NOTE — Patient Instructions (Signed)
I placed a referral to ear nose and throat and you should get a call to schedule a visit in the next week.  If you do not, let me know.

## 2021-10-19 NOTE — Progress Notes (Signed)
Subjective:     Patient ID: Bethany Valdez, female    DOB: October 26, 1965, 56 y.o.   MRN: [id number]  Chief Complaint  Patient presents with   Follow-up    2 week f/u states ear is back to normal    HPI Patient is in today for follow up on acute otitis externa and otitis media of left ear.   No longer having pain, drainage or vertigo. Having some popping when she runs. Hearing seems back to normal   No fever, chills, dizziness, headache, N/V  Health Maintenance Due  Topic Date Due   COVID-19 Vaccine (1) Never done   HIV Screening  Never done   Hepatitis C Screening  Never done   TETANUS/TDAP  Never done   Zoster Vaccines- Shingrix (1 of 2) Never done   PAP SMEAR-Modifier  Never done    Past Medical History:  Diagnosis Date   Anxiety    due to diagnosis   Breast cancer of upper-outer quadrant of left female breast ([place]) 03/10/2015   Hx of migraines    Personal history of radiation therapy    Radiation 05/09/15-06/23/15   left breast 50.4 Gy boosted to 12 Gy   Stroke [facility]) 2003    Past Surgical History:  Procedure Laterality Date   BREAST LUMPECTOMY Left 2017   [place] LYMPH NODE BIOPSY Left 03/24/2015   Procedure: LEFT BREAST SEED GUIDED LUMPECTOMY WITH LEFT AXILLARY SENTINEL NODE BIOPSY;  Surgeon: Rolm Bookbinder, MD;  Location: [place];  Service: General;  Laterality: Left;   WISDOM TOOTH EXTRACTION      Family History  Problem Relation Age of Onset   Colon polyps Neg Hx    Esophageal cancer Neg Hx    Stomach cancer Neg Hx    Rectal cancer Neg Hx     Social History   Socioeconomic History   Marital status: Divorced    Spouse name: Not on file   Number of children: Not on file   Years of education: Not on file   Highest education level: Not on file  Occupational History   Not on file  Tobacco Use   Smoking status: Never   Smokeless tobacco: Never   Vaping Use   Vaping Use: Never used  Substance and Sexual Activity   Alcohol use: Yes    Alcohol/week: 1.0 standard drink of alcohol    Types: 1 Standard drinks or equivalent per week    Comment: social   Drug use: No   Sexual activity: Not Currently  Other Topics Concern   Not on file  Social History Narrative   Not on file   Social Determinants of Health   Financial Resource Strain: Not on file  Food Insecurity: Not on file  Transportation Needs: Not on file  Physical Activity: Not on file  Stress: Not on file  Social Connections: Not on file  Intimate Partner Violence: Not on file    Outpatient Medications Prior to Visit  Medication Sig Dispense Refill   Calcium Carbonate-Vitamin D (CALCIUM-VITAMIN D) 500-200 MG-UNIT tablet Take 1 tablet by mouth daily.     cyanocobalamin 1000 MCG tablet Take 1,000 mcg by mouth daily.     tamoxifen (NOLVADEX) 20 MG tablet Take 1 tablet (20 mg total) by mouth daily. 90 tablet 3   Turmeric (QC TUMERIC COMPLEX PO) Take by mouth.     UNABLE TO FIND Take  by mouth daily. Med Name: Zannie Cove     No facility-administered medications prior to visit.    No Known Allergies  ROS     Objective:    Physical Exam  BP 100/62 (BP Location: Right Arm, Patient Position: Sitting, Cuff Size: Large)   Pulse 75   Temp 97.7 F (36.5 C) (Temporal)   Ht '5\' 2"'$  (1.575 m)   Wt 167 lb (75.8 kg)   LMP  (LMP Unknown) Comment: taking tamoxofen  SpO2 96%   BMI 30.54 kg/m  Wt Readings from Last 3 Encounters:  10/19/21 167 lb (75.8 kg)  10/12/21 165 lb 1.6 oz (74.9 kg)  10/05/21 164 lb (74.4 kg)   Left TM is dull appearing and left ear canal with erythema and mild edema.       Assessment & Plan:   Problem List Items Addressed This Visit       Nervous and Auditory   Acute otitis externa of left ear   Relevant Orders   Ambulatory referral to ENT     Other   Vertigo   Relevant Orders   Ambulatory referral to ENT   Other Visit Diagnoses      Follow-up exam    -  Primary   Subacute otitis media, unspecified otitis media type       Relevant Orders   Ambulatory referral to ENT      Here for follow-up.  Her symptoms have improved significantly but her exam is still abnormal.  I am referring her to ENT for further evaluation.  No longer having vertigo symptoms.    I am having [name] maintain her calcium-vitamin D, UNABLE TO FIND, Turmeric (QC TUMERIC COMPLEX PO), cyanocobalamin, and tamoxifen.  No orders of the defined types were placed in this encounter.

## 2021-11-06 DIAGNOSIS — Z87898 Personal history of other specified conditions: Secondary | ICD-10-CM | POA: Diagnosis not present

## 2021-11-06 DIAGNOSIS — H9212 Otorrhea, left ear: Secondary | ICD-10-CM | POA: Diagnosis not present

## 2022-04-26 IMAGING — MG MM DIGITAL SCREENING BILAT W/ TOMO AND CAD
8 series · 9 of 24 positions shown · non-contrast
Comparison: Previous exam(s).

CLINICAL DATA: Screening.

EXAM:
DIGITAL SCREENING BILATERAL MAMMOGRAM WITH TOMOSYNTHESIS AND CAD
TECHNIQUE: Bilateral screening digital craniocaudal and mediolateral oblique
mammograms were obtained. Bilateral screening digital breast
tomosynthesis was performed. The images were evaluated with
computer-aided detection.

[R CC synth-2D]
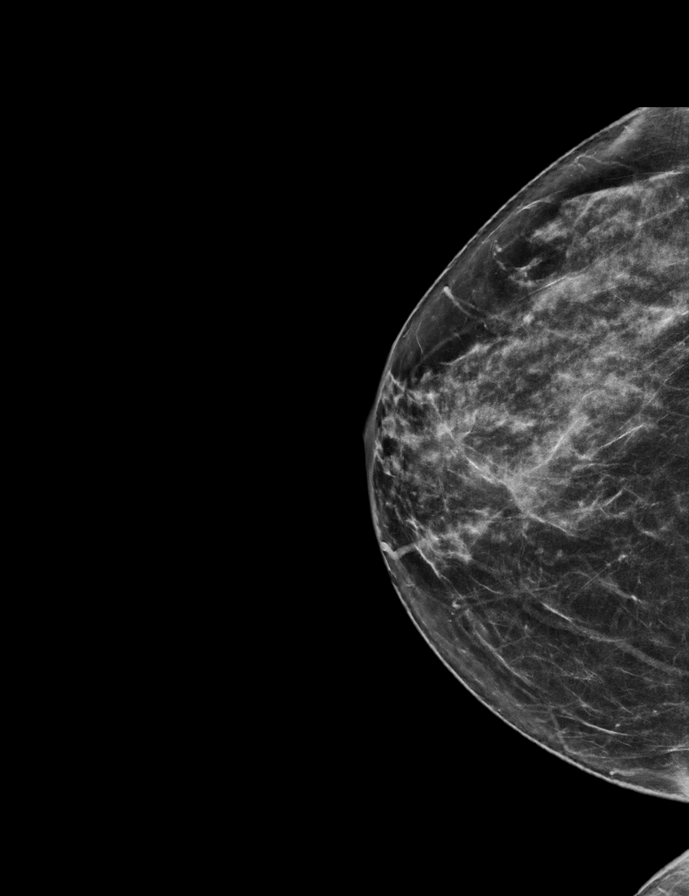

[L CC synth-2D]
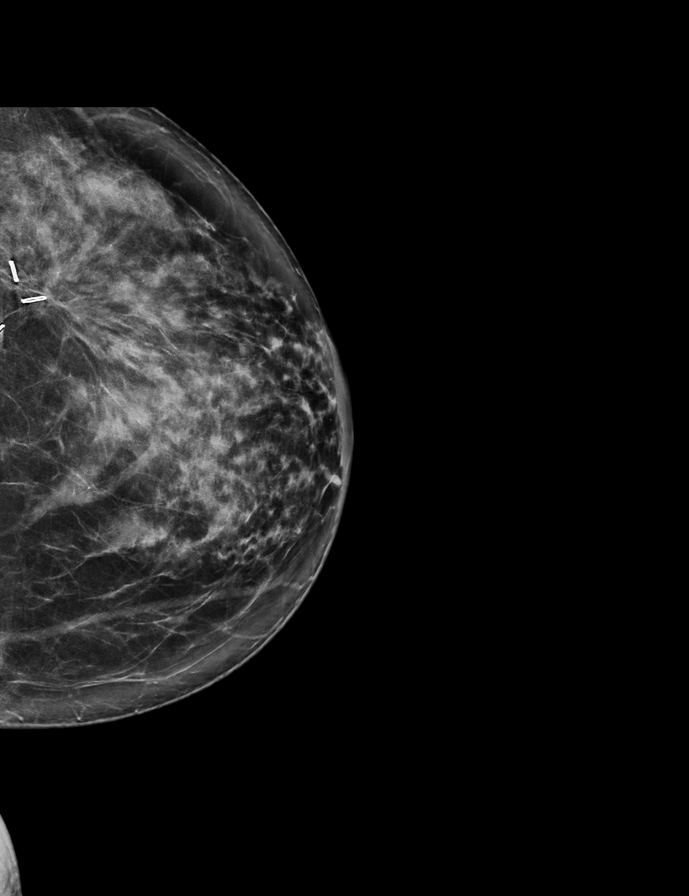

[R MLO synth-2D]
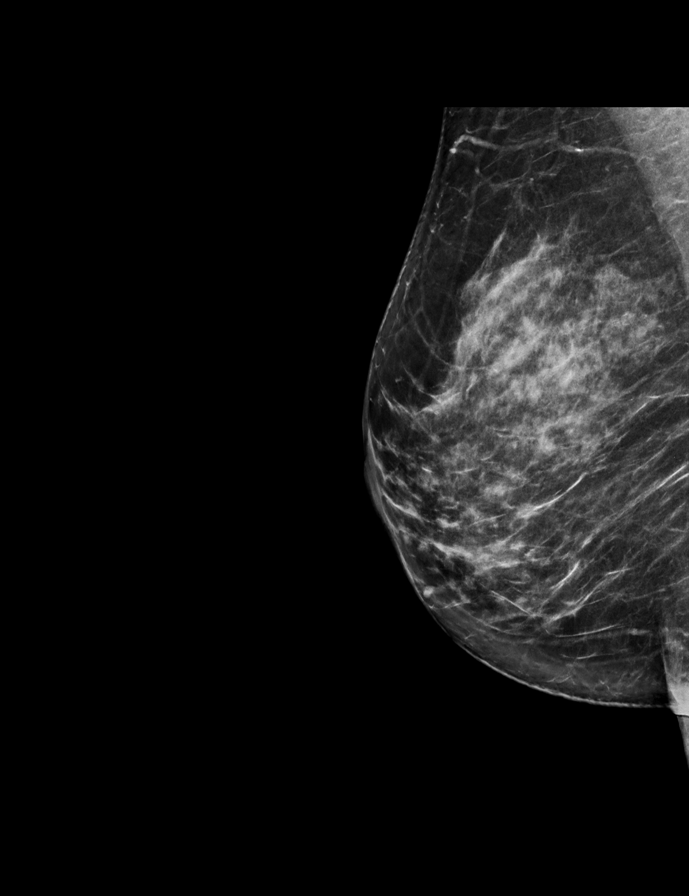

[L MLO synth-2D]
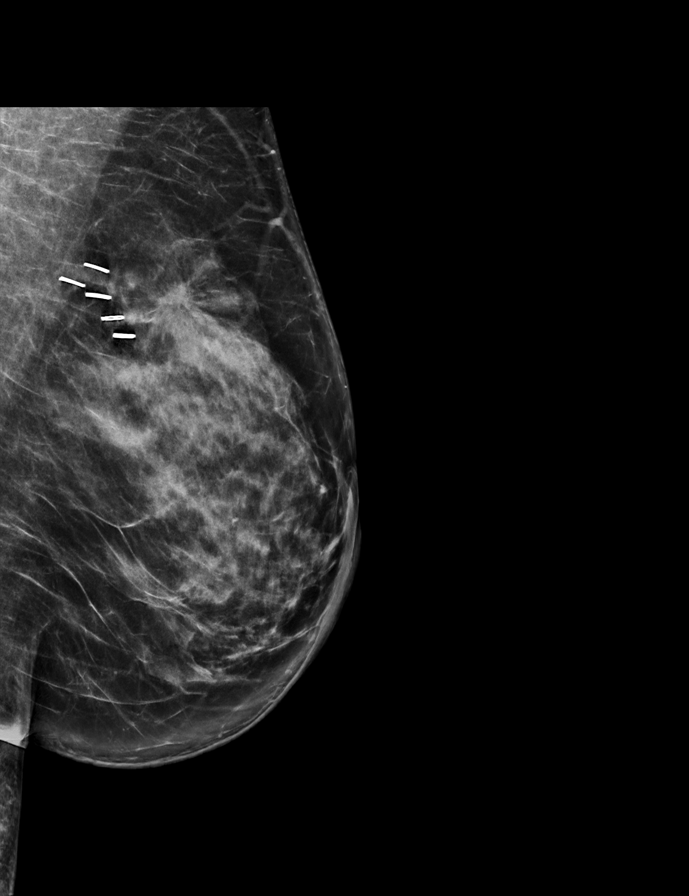

[R MLO tomo · 2 of 70 frames shown]
[frame 23/70]
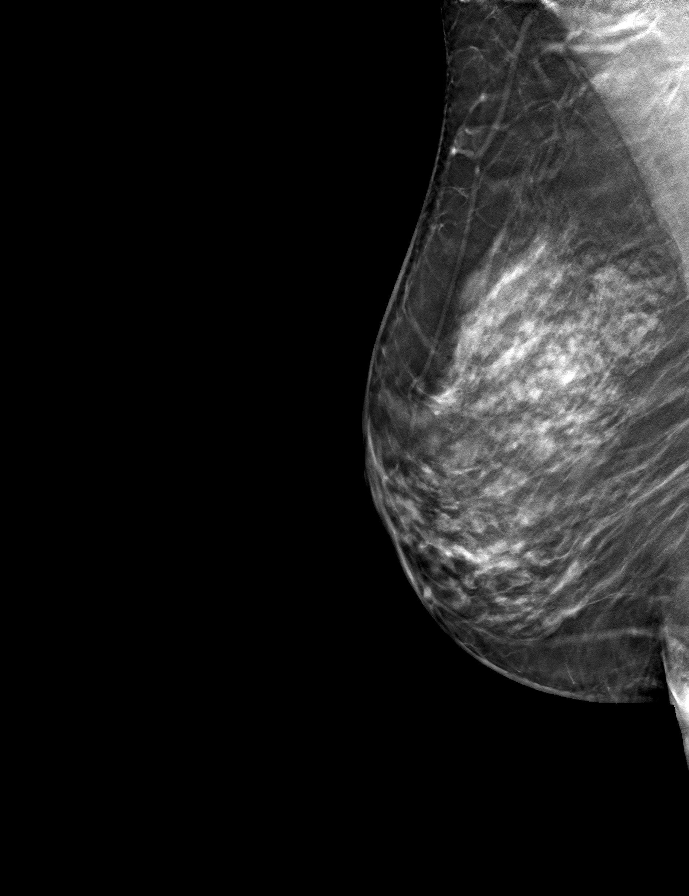
[frame 35/70]
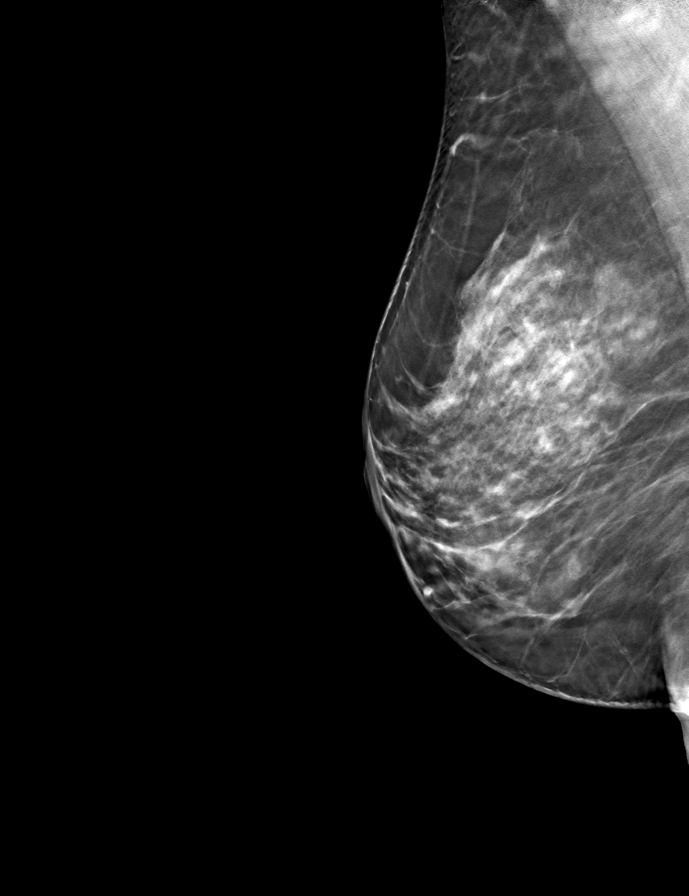

[L MLO tomo · tomo slice 38/75.0]
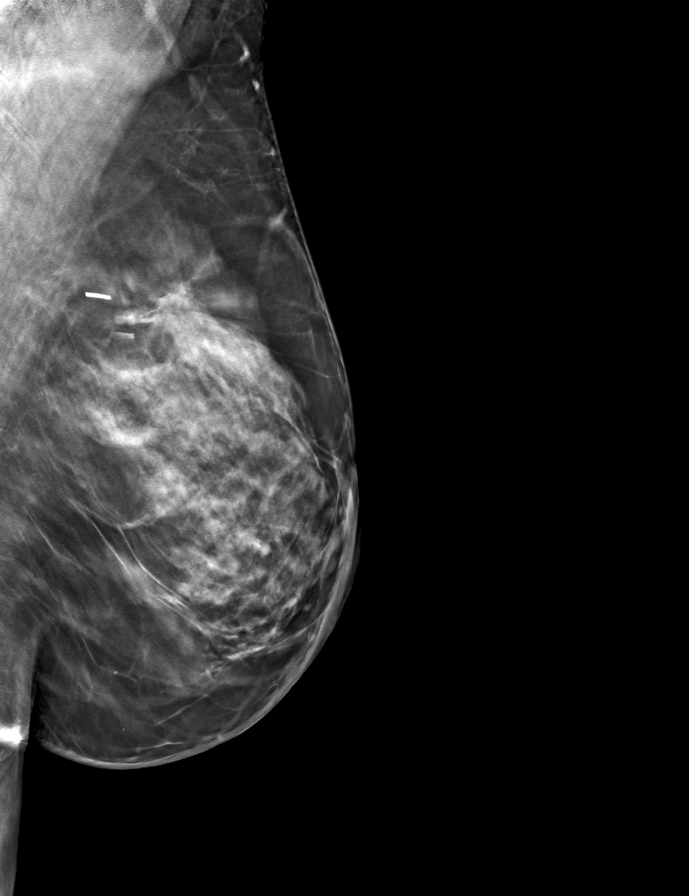

[R CC tomo · tomo slice 32/63.0]
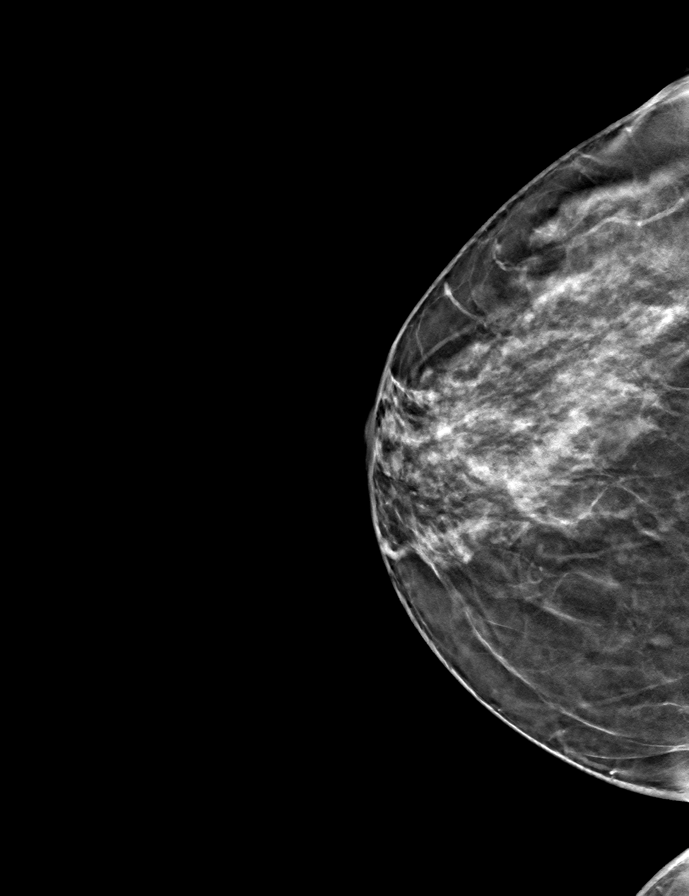

[L CC tomo · tomo slice 35/70.0]
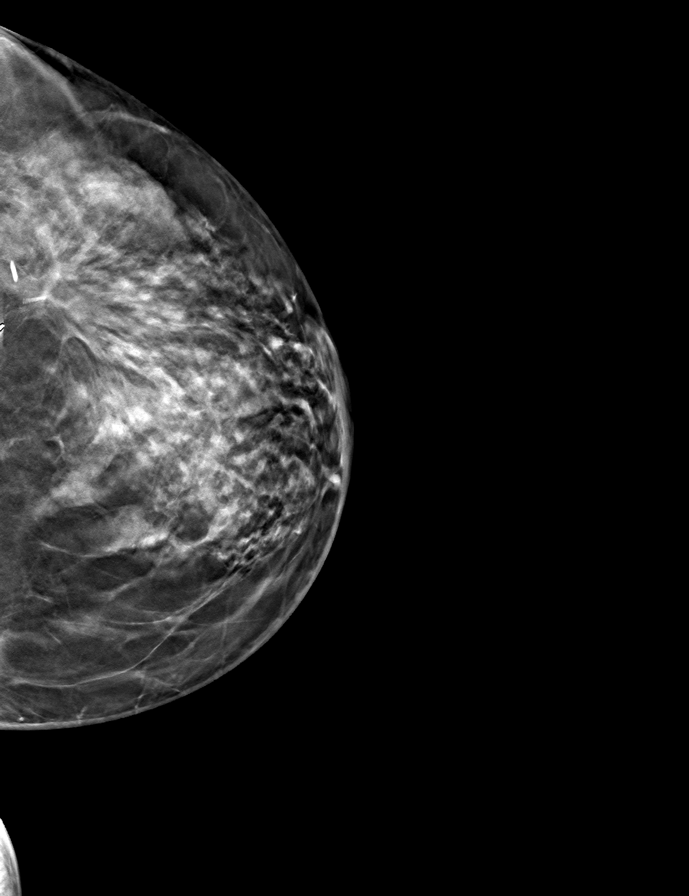

[9 of 24 positions shown; findings below may reference images not displayed]

ACR Breast Density Category c: The breast tissue is heterogeneously
dense, which may obscure small masses.
FINDINGS: There are no findings suspicious for malignancy. The images were
evaluated with computer-aided detection.
IMPRESSION: No mammographic evidence of malignancy. A result letter of this
screening mammogram will be mailed directly to the patient.

RECOMMENDATION:
Screening mammogram in one year. (Code:T4-5-GWO)

BI-RADS CATEGORY  1: Negative.

## 2022-07-13 DIAGNOSIS — Z1231 Encounter for screening mammogram for malignant neoplasm of breast: Secondary | ICD-10-CM | POA: Diagnosis not present

## 2022-10-15 ENCOUNTER — Inpatient Hospital Stay: Payer: BC Managed Care – PPO | Attending: Hematology and Oncology | Admitting: Hematology and Oncology

## 2022-10-15 ENCOUNTER — Other Ambulatory Visit: Payer: Self-pay

## 2022-10-15 VITALS — BP 125/72 | HR 82 | Temp 97.8°F | Resp 18 | Ht 62.0 in | Wt 169.0 lb

## 2022-10-15 DIAGNOSIS — Z170 Estrogen receptor positive status [ER+]: Secondary | ICD-10-CM | POA: Diagnosis not present

## 2022-10-15 DIAGNOSIS — C50412 Malignant neoplasm of upper-outer quadrant of left female breast: Secondary | ICD-10-CM | POA: Insufficient documentation

## 2022-10-15 DIAGNOSIS — Z79810 Long term (current) use of selective estrogen receptor modulators (SERMs): Secondary | ICD-10-CM | POA: Insufficient documentation

## 2022-10-15 MED ORDER — TAMOXIFEN CITRATE 20 MG PO TABS: 20.0000 mg | ORAL_TABLET | Freq: Every day | ORAL | 3 refills | Status: AC

## 2022-10-15 NOTE — Progress Notes (Signed)
Patient Care Team: Avanell Shackleton, NP-C as PCP - General (Family Medicine) Salomon Fick, NP as Nurse Practitioner (Hematology and Oncology) Antony Blackbird, MD as Consulting Physician (Radiation Oncology) Serena Croissant, MD as Consulting Physician (Hematology and Oncology) Emelia Loron, MD as Consulting Physician (General Surgery)  DIAGNOSIS:  Encounter Diagnoses  Name Primary?   Malignant neoplasm of upper-outer quadrant of left breast in female, estrogen receptor positive (HCC) Yes   Breast cancer of upper-outer quadrant of left female breast (HCC)     SUMMARY OF ONCOLOGIC HISTORY: Oncology History  Breast cancer of upper-outer quadrant of left female breast (HCC)  03/03/2015 Mammogram   An area of distortion is confirmed within the upper-outer quadrant of the left breast, at posterior depth   03/03/2015 Breast US   Spiculated hypoechoic mass within the left breast at the 1 o'clock axis, 6 cm from the nipple, measuring 1.6 x 1.2 x 1 cm, corresponding to the mammographic finding.   03/08/2015 Initial Diagnosis   Left breast 1:00 biopsy: Invasive ductal carcinoma with DCIS, grade 1-2, ER 90%, PR 100%, HER-2 negative ratio 1.35, Ki-67 5%, 1.6 x 1.2 x 1 cm left breast distortion   03/08/2015 Clinical Stage   Stage IA: T1c N0   03/24/2015 Surgery   Left Lumpectomy: IDC grade 1, 1.7 cm, 0/6 LN neg ER 90%, PR 100%, HER-2 negative ratio 1.35, Ki-67 5%   03/24/2015 Pathologic Stage   Stage IA: T1c N0   03/24/2015 Oncotype testing   RS 13 (8% ROR)   05/09/2015 - 06/23/2015 Radiation Therapy   Adjuvant XRT: The Left breast was treated to 50.4 Gy in 28 fractions at 1.8 Gy per fraction. The Left breast was boosted to 12 Gy in 6 fractions at 2 Gy per fraction.   07/04/2015 -  Anti-estrogen oral therapy   Tamoxifen 20 mg daily 10 years   09/02/2015 Survivorship   SCP mailed to patient in lieu of in person visit     CHIEF COMPLIANT: Follow-up of left breast cancer on  tamoxifen therapy   INTERVAL HISTORY: Bethany Valdez is a 57 y.o. with above-mentioned history of left breast cancer treated with lumpectomy, radiation, and who is currently on anti-estrogen therapy with tamoxifen. She presents to the clinic today for a follow-up. Patient is tolerating the tamoxifen. She does have moderate hot flashes. Does have some nerve pain that she was already aware of. She has been dealing with the passing of her mother last month.    ALLERGIES:  has No Known Allergies.  MEDICATIONS:  Current Outpatient Medications  Medication Sig Dispense Refill   Calcium Carbonate-Vitamin D (CALCIUM-VITAMIN D) 500-200 MG-UNIT tablet Take 1 tablet by mouth daily.     cyanocobalamin 1000 MCG tablet Take 1,000 mcg by mouth daily.     levonorgestrel (MIRENA, 52 MG,) 20 MCG/DAY IUD Take by intrauterine route.     Turmeric (QC TUMERIC COMPLEX PO) Take by mouth.     UNABLE TO FIND Take by mouth daily. Med Name: Almyra Free     tamoxifen (NOLVADEX) 20 MG tablet Take 1 tablet (20 mg total) by mouth daily. 90 tablet 3   No current facility-administered medications for this visit.    PHYSICAL EXAMINATION: ECOG PERFORMANCE STATUS: 1 - Symptomatic but completely ambulatory  Vitals:   10/15/22 1523  BP: 125/72  Pulse: 82  Resp: 18  Temp: 97.8 F (36.6 C)  SpO2: 98%   Filed Weights   10/15/22 1523  Weight: 169 lb (76.7 kg)  BREAST: No palpable masses or nodules in either right or left breasts. No palpable axillary supraclavicular or infraclavicular adenopathy no breast tenderness or nipple discharge. (exam performed in the presence of a chaperone)  LABORATORY DATA:  I have reviewed the data as listed    Latest Ref Rng & Units 03/16/2015   12:35 PM  CMP  Glucose 70 - 140 mg/dl 95   BUN 7.0 - 16.1 mg/dL 9.3   Creatinine 0.6 - 1.1 mg/dL 0.8   Sodium 096 - 045 mEq/L 142   Potassium 3.5 - 5.1 mEq/L 3.9   CO2 22 - 29 mEq/L 27   Calcium 8.4 - 10.4 mg/dL 9.3   Total Protein 6.4  - 8.3 g/dL 6.9   Total Bilirubin 4.09 - 1.20 mg/dL 8.11   Alkaline Phos 40 - 150 U/L 59   AST 5 - 34 U/L 16   ALT 0 - 55 U/L 13     Lab Results  Component Value Date   WBC 10.7 (H) 03/16/2015   HGB 14.4 03/16/2015   HCT 42.7 03/16/2015   MCV 99.4 03/16/2015   PLT 263 03/16/2015   NEUTROABS 7.0 (H) 03/16/2015    ASSESSMENT & PLAN:  Breast cancer of upper-outer quadrant of left female breast (HCC) Left Lumpectomy 03/24/15: IDC grade 1, 1.7 cm, 0/6 LN neg ER 90%, PR 100%, HER-2 negative ratio 1.35, Ki-67 5% T1C N0 (Stage 1A) Oncotype DX score 13, 8% risk of recurrence Adjuvant radiation therapy 05/09/2015 to 06/23/2015 Current treatment: Tamoxifen 20 mg daily started 07/04/2015   Tamoxifen toxicities: 1.  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   Her son had emergency brain surgery 3 years ago and made a full recovery.  He has a Biomedical engineer business.   Surveillance: Mammogram 06/20/2020: Benign breast density category C Breast exam 10/15/2022: Benign   Her mother passed away last month.  She is grieving from that. Return to clinic in 1 year for follow-up   No orders of the defined types were placed in this encounter.  The patient has a good understanding of the overall plan. she agrees with it. she will call with any problems that may develop before the next visit here. Total time spent: 30 mins including face to face time and time spent for planning, charting and co-ordination of care   Tamsen Meek, MD 10/15/22    I Janan Ridge am acting as a Neurosurgeon for [employer]  I have reviewed the above documentation for accuracy and completeness, and I agree with the above.

## 2022-10-15 NOTE — Assessment & Plan Note (Addendum)
Left Lumpectomy 03/24/15: IDC grade 1, 1.7 cm, 0/6 LN neg ER 90%, PR 100%, HER-2 negative ratio 1.35, Ki-67 5% T1C N0 (Stage 1A) Oncotype DX score 13, 8% risk of recurrence Adjuvant radiation therapy 05/09/2015 to 06/23/2015 Current treatment: Tamoxifen 20 mg daily started 07/04/2015   Tamoxifen toxicities: 1.  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   Her son had emergency brain surgery  and made a full recovery.  He has a Biomedical engineer business.   Surveillance: Mammogram 06/20/2020: Benign breast density category C Breast exam 10/15/2022: Benign   Return to clinic in 1 year for follow-up

## 2022-10-18 ENCOUNTER — Encounter: Payer: Self-pay | Admitting: Diagnostic Radiology

## 2023-02-02 DIAGNOSIS — J0100 Acute maxillary sinusitis, unspecified: Secondary | ICD-10-CM | POA: Diagnosis not present

## 2023-05-30 ENCOUNTER — Encounter: Payer: Self-pay | Admitting: *Deleted

## 2023-07-16 DIAGNOSIS — Z01419 Encounter for gynecological examination (general) (routine) without abnormal findings: Secondary | ICD-10-CM | POA: Diagnosis not present

## 2023-07-16 DIAGNOSIS — Z1231 Encounter for screening mammogram for malignant neoplasm of breast: Secondary | ICD-10-CM | POA: Diagnosis not present

## 2023-07-16 DIAGNOSIS — Z130 Encounter for screening for diseases of the blood and blood-forming organs and certain disorders involving the immune mechanism: Secondary | ICD-10-CM | POA: Diagnosis not present

## 2023-09-25 ENCOUNTER — Telehealth: Payer: Self-pay | Admitting: Hematology and Oncology

## 2023-09-25 NOTE — Telephone Encounter (Signed)
 The patient called to cancel appointment and did not want to reschedule at this time.

## 2023-10-15 ENCOUNTER — Ambulatory Visit: Payer: BC Managed Care – PPO | Admitting: Hematology and Oncology
